# Patient Record
Sex: Female | Born: 1945
Health system: Southern US, Community
[De-identification: ages and names within clinical notes are randomized; demographics above are authoritative.]

## PROBLEM LIST (undated history)

## (undated) DIAGNOSIS — M1990 Unspecified osteoarthritis, unspecified site: Secondary | ICD-10-CM

## (undated) DIAGNOSIS — C801 Malignant (primary) neoplasm, unspecified: Secondary | ICD-10-CM

## (undated) DIAGNOSIS — T7840XA Allergy, unspecified, initial encounter: Secondary | ICD-10-CM

## (undated) DIAGNOSIS — E785 Hyperlipidemia, unspecified: Secondary | ICD-10-CM

## (undated) DIAGNOSIS — J309 Allergic rhinitis, unspecified: Secondary | ICD-10-CM

## (undated) DIAGNOSIS — E538 Deficiency of other specified B group vitamins: Secondary | ICD-10-CM

## (undated) DIAGNOSIS — M810 Age-related osteoporosis without current pathological fracture: Secondary | ICD-10-CM

## (undated) DIAGNOSIS — K219 Gastro-esophageal reflux disease without esophagitis: Secondary | ICD-10-CM

## (undated) HISTORY — DX: Allergic rhinitis, unspecified: J30.9

## (undated) HISTORY — DX: Malignant (primary) neoplasm, unspecified: C80.1

## (undated) HISTORY — DX: Age-related osteoporosis without current pathological fracture: M81.0

## (undated) HISTORY — DX: Deficiency of other specified B group vitamins: E53.8

## (undated) HISTORY — DX: Gastro-esophageal reflux disease without esophagitis: K21.9

## (undated) HISTORY — DX: Unspecified osteoarthritis, unspecified site: M19.90

## (undated) HISTORY — DX: Allergy, unspecified, initial encounter: T78.40XA

## (undated) HISTORY — DX: Hyperlipidemia, unspecified: E78.5

## (undated) HISTORY — PX: NASAL SINUS SURGERY: SHX719

---

## 1999-09-25 ENCOUNTER — Encounter: Admission: RE | Admit: 1999-09-25 | Discharge: 1999-09-25 | Payer: Self-pay | Admitting: Gynecology

## 1999-09-25 ENCOUNTER — Encounter: Payer: Self-pay | Admitting: Gynecology

## 2000-10-05 ENCOUNTER — Encounter: Payer: Self-pay | Admitting: Gynecology

## 2000-10-05 ENCOUNTER — Encounter: Admission: RE | Admit: 2000-10-05 | Discharge: 2000-10-05 | Payer: Self-pay | Admitting: Gynecology

## 2000-10-17 ENCOUNTER — Other Ambulatory Visit: Admission: RE | Admit: 2000-10-17 | Discharge: 2000-10-17 | Payer: Self-pay | Admitting: Gynecology

## 2000-12-05 ENCOUNTER — Encounter: Admission: RE | Admit: 2000-12-05 | Discharge: 2000-12-05 | Payer: Self-pay | Admitting: Gynecology

## 2000-12-05 ENCOUNTER — Encounter: Payer: Self-pay | Admitting: Gynecology

## 2001-09-26 ENCOUNTER — Emergency Department (HOSPITAL_COMMUNITY): Admission: EM | Admit: 2001-09-26 | Discharge: 2001-09-26 | Payer: Self-pay | Admitting: Emergency Medicine

## 2002-03-08 ENCOUNTER — Other Ambulatory Visit: Admission: RE | Admit: 2002-03-08 | Discharge: 2002-03-08 | Payer: Self-pay | Admitting: Gynecology

## 2003-02-13 ENCOUNTER — Encounter: Admission: RE | Admit: 2003-02-13 | Discharge: 2003-02-13 | Payer: Self-pay | Admitting: Gynecology

## 2003-03-11 ENCOUNTER — Other Ambulatory Visit: Admission: RE | Admit: 2003-03-11 | Discharge: 2003-03-11 | Payer: Self-pay | Admitting: Gynecology

## 2004-03-18 ENCOUNTER — Encounter: Admission: RE | Admit: 2004-03-18 | Discharge: 2004-03-18 | Payer: Self-pay | Admitting: Family Medicine

## 2004-05-06 ENCOUNTER — Other Ambulatory Visit: Admission: RE | Admit: 2004-05-06 | Discharge: 2004-05-06 | Payer: Self-pay | Admitting: Gynecology

## 2004-05-13 ENCOUNTER — Encounter: Admission: RE | Admit: 2004-05-13 | Discharge: 2004-05-13 | Payer: Self-pay | Admitting: Gynecology

## 2005-06-30 ENCOUNTER — Other Ambulatory Visit: Admission: RE | Admit: 2005-06-30 | Discharge: 2005-06-30 | Payer: Self-pay | Admitting: Gynecology

## 2005-07-08 ENCOUNTER — Encounter: Admission: RE | Admit: 2005-07-08 | Discharge: 2005-07-08 | Payer: Self-pay | Admitting: Gynecology

## 2006-09-27 ENCOUNTER — Other Ambulatory Visit: Admission: RE | Admit: 2006-09-27 | Discharge: 2006-09-27 | Payer: Self-pay | Admitting: Gynecology

## 2006-10-07 ENCOUNTER — Encounter: Admission: RE | Admit: 2006-10-07 | Discharge: 2006-10-07 | Payer: Self-pay | Admitting: Gynecology

## 2007-11-06 ENCOUNTER — Encounter: Admission: RE | Admit: 2007-11-06 | Discharge: 2007-11-06 | Payer: Self-pay | Admitting: Gynecology

## 2008-11-06 ENCOUNTER — Encounter: Payer: Self-pay | Admitting: Internal Medicine

## 2008-11-06 ENCOUNTER — Encounter: Admission: RE | Admit: 2008-11-06 | Discharge: 2008-11-06 | Payer: Self-pay | Admitting: Gynecology

## 2008-11-06 LAB — HM MAMMOGRAPHY

## 2008-11-25 ENCOUNTER — Encounter: Payer: Self-pay | Admitting: Internal Medicine

## 2008-12-03 ENCOUNTER — Encounter: Payer: Self-pay | Admitting: Internal Medicine

## 2008-12-16 ENCOUNTER — Encounter: Payer: Self-pay | Admitting: Internal Medicine

## 2009-01-28 ENCOUNTER — Encounter: Payer: Self-pay | Admitting: Internal Medicine

## 2009-04-29 ENCOUNTER — Encounter: Payer: Self-pay | Admitting: Internal Medicine

## 2009-10-24 ENCOUNTER — Ambulatory Visit: Payer: Self-pay | Admitting: Internal Medicine

## 2009-10-24 ENCOUNTER — Ambulatory Visit: Payer: Self-pay

## 2009-10-24 DIAGNOSIS — M810 Age-related osteoporosis without current pathological fracture: Secondary | ICD-10-CM

## 2009-10-24 DIAGNOSIS — M129 Arthropathy, unspecified: Secondary | ICD-10-CM | POA: Insufficient documentation

## 2009-10-24 DIAGNOSIS — E785 Hyperlipidemia, unspecified: Secondary | ICD-10-CM

## 2009-10-24 DIAGNOSIS — J309 Allergic rhinitis, unspecified: Secondary | ICD-10-CM | POA: Insufficient documentation

## 2009-10-27 ENCOUNTER — Telehealth: Payer: Self-pay | Admitting: Internal Medicine

## 2009-11-03 ENCOUNTER — Telehealth: Payer: Self-pay | Admitting: Internal Medicine

## 2009-11-07 ENCOUNTER — Encounter: Payer: Self-pay | Admitting: Internal Medicine

## 2009-11-10 ENCOUNTER — Encounter: Admission: RE | Admit: 2009-11-10 | Discharge: 2009-11-10 | Payer: Self-pay | Admitting: Gynecology

## 2010-01-14 ENCOUNTER — Ambulatory Visit: Payer: Self-pay | Admitting: Internal Medicine

## 2010-01-14 DIAGNOSIS — R141 Gas pain: Secondary | ICD-10-CM | POA: Insufficient documentation

## 2010-01-14 DIAGNOSIS — R143 Flatulence: Secondary | ICD-10-CM

## 2010-01-14 DIAGNOSIS — R142 Eructation: Secondary | ICD-10-CM

## 2010-03-01 LAB — CONVERTED CEMR LAB
ALT: 24 units/L
AST: 31 units/L
Albumin: 3.8 g/dL (ref 3.5–5.2)
Albumin: 4.2 g/dL
Basophils Relative: 0.2 %
Bilirubin Urine: NEGATIVE
Bilirubin, Direct: 0.1 mg/dL (ref 0.0–0.3)
CRP: 19.2 mg/dL
CRP: 2.8 mg/dL
Calcium: 9.5 mg/dL (ref 8.4–10.5)
Creatinine, Ser: 0.9 mg/dL (ref 0.4–1.2)
Eosinophils Absolute: 0.1 10*3/uL (ref 0.0–0.7)
Eosinophils Relative: 2 %
Glucose, Bld: 86 mg/dL
HCT: 40.6 % (ref 36.0–46.0)
HCT: 48.1 %
HDL: 33 mg/dL
Hemoglobin, Urine: NEGATIVE
LDL Cholesterol: 105 mg/dL
Leukocytes, UA: NEGATIVE
Lymphocytes Relative: 35.2 % (ref 12.0–46.0)
Lymphocytes, automated: 24 %
MCV: 96.4 fL (ref 78.0–100.0)
MCV: 98.6 fL
Monocytes Absolute: 0.8 10*3/uL (ref 0.1–1.0)
Monocytes Relative: 7 %
Monocytes Relative: 9.9 % (ref 3.0–12.0)
Neutrophils Relative %: 52.8 % (ref 43.0–77.0)
Neutrophils Relative %: 67 %
Nitrite: NEGATIVE
Potassium: 4.4 meq/L
RDW: 12.4 % (ref 11.5–14.6)
Sodium: 141 meq/L (ref 135–145)
Specific Gravity, Urine: >=1.03 (ref 1.000–1.030)
TSH: 1.32 microintl units/mL (ref 0.35–5.50)
Total CHOL/HDL Ratio: 5
Total Protein, Urine: NEGATIVE mg/dL
Triglyceride fasting, serum: 123 mg/dL
Triglycerides: 345 mg/dL — ABNORMAL HIGH (ref 0.0–149.0)
VLDL: 69 mg/dL — ABNORMAL HIGH (ref 0.0–40.0)
WBC: 8.5 10*3/uL (ref 4.5–10.5)
pH: 5.5 (ref 5.0–8.0)

## 2010-03-05 NOTE — Letter (Signed)
Summary: Gretta Cool MD  Gretta Cool MD   Imported By: Lester [place] 11/13/2009 09:49:38  _____________________________________________________________________  External Attachment:    Type:   Image     Comment:   External Document

## 2010-03-05 NOTE — Progress Notes (Signed)
Summary: Swelling  Phone Note Call from Patient Call back at Work Phone [phone]   Caller: Patient Summary of Call: Pt called stating that she is still having swelling of knees and ankles. Pt is requesting MD advise on next "step" or further testing? Please advise Initial call taken by: Margaret Pyle, CMA,  November 03, 2009 8:49 AM  Follow-up for Phone Call        refer to ortho for eval and tx of same Follow-up by: Newt Lukes MD,  November 03, 2009 9:13 AM  Additional Follow-up for Phone Call Additional follow up Details #1::        Pt informed and will expect call from [facility] with appt information. Additional Follow-up by: Margaret Pyle, CMA,  November 03, 2009 9:24 AM

## 2010-03-05 NOTE — Assessment & Plan Note (Signed)
Summary: NEW . [employer] / #/CD   Vital Signs:  Patient profile:   65 year old female Height:      61 inches (154.94 cm) Weight:      141.4 pounds (64.27 kg) BMI:     26.81 O2 Sat:      95 % on Room air Temp:     97.8 degrees F (36.56 degrees C) oral Pulse rate:   74 / minute BP sitting:   130 / 72  (left arm) Cuff size:   regular  Vitals Entered By: Orlan Leavens RMA (October 24, 2009 2:02 PM)  O2 Flow:  Room air CC: New patient Is Patient Diabetic? No Pain Assessment Patient in pain? no        Primary Care Marquon Alcala:  Newt Lukes MD  CC:  New patient.  History of Present Illness: new pt to me and our practice, here toe st care -  has followed with gyn recently, [facility] ([facility]) remotely  patient is here today for annual physical. Patient feels well . has always declined colo and never returned hemocult cards   also concerned with swelling of both ankles and right knee onset 3 weeks ago - symptoms worse at end of day - prolnged sitting at work denies injury or pain to knee/ankles but + "ache" behind right knee no change in meds or diet - no hx blood clots - quit smoking 11/2008  also review chronic med issues -  dyslipidemia - has not had reck since med rx'd 3 years ago - ran out, then refilled by gyn - reports compliance with ongoing medical treatment and no changes in medication dose or frequency. denies adverse side effects related to current therapy.   allg rhinitis - ?chornic sinus drainage - pressure but no pain, no fever but thick nasal dc down throat each am - no sneezing - HA with weather change - uses OTC meds as needed -some relief  osteoporosis - DEXA last year at gyn - progressive bone loss - prev on actonel, change to boniva by insurance pref - no bone pain or falls, no troubkle swallowing or jaw pain  OA - diffuse in hands, esp left thumb, wrists, elbows and neck   Preventive Screening-Counseling & Management  Alcohol-Tobacco     Alcohol  drinks/day: <1     Alcohol Counseling: not indicated; use of alcohol is not excessive or problematic     Smoking Status: quit     Year Quit: 11/2008     Tobacco Counseling: to remain off tobacco products  Caffeine-Diet-Exercise     Caffeine Counseling: not indicated; caffeine use is not excessive or problematic     Diet Counseling: to improve diet; diet is suboptimal     Does Patient Exercise: no     Exercise Counseling: to improve exercise regimen     Depression Counseling: not indicated; screening negative for depression  Safety-Violence-Falls     Seat Belt Counseling: not indicated; patient wears seat belts     Helmet Counseling: not applicable     Firearm Counseling: not indicated; uses recommended firearm safety measures     Violence Counseling: not indicated; no violence risk noted     Fall Risk Counseling: not indicated; no significant falls noted  Clinical Review Panels:  Prevention   Last Mammogram:  No specific mammographic evidence of malignancy.   (11/01/2008)   Last Pap Smear:  Interpretation Result:Negative for intraepithelial Lesion or Malignancy.    (11/01/2008)  Immunizations   Last Tetanus  Booster:  Historical (02/02/2003)   Current Medications (verified): 1)  Simvastatin 40 Mg Tabs (Simvastatin) .... Take 1 By Mouth Once Daily 2)  Thera-M Multivitamin .... Take 1 By Mouth Once Daily 3)  Fish Oil 2000 Mg Caps (Omega-3 Fatty Acids) .... Take Once Daily 4)  Aspirin 81 Mg Tabs (Aspirin) .... Once Daily 5)  Vitamin D3 2000 Unit Caps (Cholecalciferol) .... Take 1 By Mouth Once Daily 6)  Niacin 500 Mg Tabs (Niacin) .... Take 1 By Mouth Once Daily 7)  Omeprazole 20 Mg Tbec (Omeprazole) .... Take 1 By Mouth Once Daily 8)  Boniva 150 Mg Tabs (Ibandronate Sodium) .... Take 1 By Mouth Monthly  Allergies (verified): 1)  ! Penicillin  Past History:  Past Medical History: Hyperlipidemia osteoporosis GERD OA - left thumb CMC>hands, knee  MD roster: gyn -  lomax  Past Surgical History: Denies surgical history  Family History: Reviewed history and no changes required. Family History of Arthritis (both parents) Family History High cholesterol (both parents) Family History Hypertension (mother) Family History Lung cancer (father) Family History of Prostate CA 1st degree relative <50 (father)  Social History: Former Smoker - quit 11/2008 rare alcohol use married, lives with spouse and g-gson (raising him) works as Scientist, physiological at [employer] Smoking Status:  quit Does Patient Exercise:  no  Review of Systems       see HPI above. I have reviewed all other systems and they were negative.   Physical Exam  General:  alert, well-developed, well-nourished, and cooperative to examination.    Head:  Normocephalic and atraumatic without obvious abnormalities. No apparent alopecia or balding. Eyes:  vision grossly intact; pupils equal, round and reactive to light.  conjunctiva and lids normal.    Ears:  normal pinnae bilaterally, without erythema, swelling, or tenderness to palpation. TMs clear, without effusion, or cerumen impaction. Hearing grossly normal bilaterally  Mouth:  teeth and gums in good repair; mucous membranes moist, without lesions or ulcers. oropharynx clear without exudate, no erythema. +PND Neck:  supple, full ROM, no masses, no thyromegaly; no thyroid nodules or tenderness. no JVD or carotid bruits.   Lungs:  normal respiratory effort, no intercostal retractions or use of accessory muscles; normal breath sounds bilaterally - no crackles and no wheezes.    Heart:  normal rate, regular rhythm, no murmur, and no rub. BLE without edema but mild inc R>L LE at calf. normal DP pulses and normal cap refill in all 4 extremities    Abdomen:  soft, non-tender, normal bowel sounds, no distention; no masses and no appreciable hepatomegaly or splenomegaly.   Genitalia:  defer gyn Msk:  No deformity or scoliosis noted of thoracic or  lumbar spine.  right knee: full range of motion, no joint effusion or swelling but "fullness" posterior to knee. no erythema or abnormal warmth. Stable to ligamentous testing. Nontender to palpation. Neurovascularly intact.  Neurologic:  alert & oriented X3 and cranial nerves II-XII symetrically intact.  strength normal in all extremities, sensation intact to light touch, and gait normal. speech fluent without dysarthria or aphasia; follows commands with good comprehension.  Skin:  no rashes, vesicles, ulcers, or erythema. No nodules or irregularity to palpation.  Psych:  Oriented X3, memory intact for recent and remote, normally interactive, good eye contact, not anxious appearing, not depressed appearing, and not agitated.      Impression & Recommendations:  Problem # 1:  PREVENTIVE HEALTH CARE (ICD-V70.0) Patient has been counseled on age-appropriate routine health concerns for screening  and prevention. These are reviewed and up-to-date. Immunizations are up-to-date or declined. Labs ordered; EKG and colo refer declined.  Orders: TLB-Lipid Panel (80061-LIPID) TLB-BMP (Basic Metabolic Panel-BMET) (80048-METABOL) TLB-CBC Platelet - w/Differential (85025-CBCD) TLB-Hepatic/Liver Function Pnl (80076-HEPATIC) TLB-TSH (Thyroid Stimulating Hormone) (84443-TSH) TLB-Udip w/ Micro (81001-URINE)  Problem # 2:  ALLERGIC RHINITIS (ICD-477.9)  no evidence for sinus infx - add nasal steroids for pressure and discharge -  Her updated medication list for this problem includes:    Nasonex 50 Mcg/act Susp (Mometasone furoate) .Marland Kitchen... 2 sprays each nostril every morning  Discussed use of allergy medications and environmental measures.   Orders: Prescription Created Electronically [phone])  Problem # 3:  SWELLING, LIMB (ICD-729.81) min w/o edema R>L LE fullness -  ?ruptured bakers cyst seems most likely with known OA hx - check doppler - consider need for diuretic or NSAID afer further review - also labs  today r/o renal or liver dz Orders: Misc. Referral (Misc. Ref)  Problem # 4:  HYPERLIPIDEMIA (ICD-272.4) cehck las - send for prior records to review Her updated medication list for this problem includes:    Simvastatin 40 Mg Tabs (Simvastatin) .Marland Kitchen... Take 1 by mouth once daily    Niacin 500 Mg Tabs (Niacin) .Marland Kitchen... Take 1 by mouth once daily  Problem # 5:  OSTEOPOROSIS (ICD-733.00)  send for copt of DEXA from gyn to review cont vit d + calicum  with boniva Her updated medication list for this problem includes:    Boniva 150 Mg Tabs (Ibandronate sodium) .Marland Kitchen... Take 1 by mouth monthly  Discussed medication use, applications of heat or ice, and exercises.   Complete Medication List: 1)  Simvastatin 40 Mg Tabs (Simvastatin) .... Take 1 by mouth once daily 2)  Thera-m Multivitamin  .... Take 1 by mouth once daily 3)  Fish Oil 2000 Mg Caps (omega-3 Fatty Acids)  .... Take once daily 4)  Aspirin 81 Mg Tabs (Aspirin) .... Once daily 5)  Vitamin D3 2000 Unit Caps (Cholecalciferol) .... Take 1 by mouth once daily 6)  Niacin 500 Mg Tabs (Niacin) .... Take 1 by mouth once daily 7)  Omeprazole 20 Mg Tbec (Omeprazole) .... Take 1 by mouth once daily 8)  Boniva 150 Mg Tabs (Ibandronate sodium) .... Take 1 by mouth monthly 9)  Nasonex 50 Mcg/act Susp (Mometasone furoate) .... 2 sprays each nostril every morning  Patient Instructions: 1)  it was good to see you today. 2)  test(s) ordered today - your results will be posted on the phone tree for review in 48-72 hours from the time of test completion; call [phone] and enter your 9 digit MRN (listed above on this page, just below your name); if any changes need to be made or there are abnormal results, you will be contacted directly.  3)  will re-discuss colonoscopy and other recommended immunizations next visit 4)  use nasonex spray for your sinus and allergy symptoms - your prescription has been electronically submitted to your pharmacy. Please  take as directed. Contact our office if you believe you're having problems with the medication(s).  5)  no other medication changes today 6)  we'll make referral for doppler scan of your leg . Our office will contact you regarding this appointment once made.  Then, you will be called with results and treatment plans after review of these results 7)  will send for copy of labs/reports from dr. Nicholas Lose to review 8)  Please schedule a follow-up appointment in 3 months to continue review  of your medical health, call sooner if problems.  Prescriptions: NASONEX 50 MCG/ACT SUSP (MOMETASONE FUROATE) 2 sprays each nostril every morning  #1 x 1   Entered and Authorized by:   Newt Lukes MD   Signed by:   Newt Lukes MD on 10/24/2009   Method used:   Electronically to        [id number] (retail)       [address]       [place], [place]  [address]       Ph: [phone] or [phone]       Fax: [phone]   RxID:   [id number]    Immunization History:  Tetanus/Td Immunization History:    Tetanus/Td:  historical (02/02/2003)    Pap Smear  Procedure date:  11/01/2008  Findings:      Interpretation Result:Negative for intraepithelial Lesion or Malignancy.     Mammogram  Procedure date:  11/01/2008  Findings:      No specific mammographic evidence of malignancy.

## 2010-03-05 NOTE — Assessment & Plan Note (Signed)
Summary: 3 MTH FU---STC   Vital Signs:  Patient profile:   65 year old female Height:      61 inches (154.94 cm) Weight:      140.12 pounds (63.69 kg) O2 Sat:      94 % on Room air Temp:     98.6 degrees F (37.00 degrees C) oral Pulse rate:   84 / minute BP sitting:   120 / 78  (left arm) Cuff size:   regular  Vitals Entered By: Orlan Leavens RMA (January 14, 2010 2:33 PM)  O2 Flow:  Room air CC: 3 month follow-up Is Patient Diabetic? No Pain Assessment Patient in pain? no      Comments Want to discuss boniva   Primary Care Provider:  Newt Lukes MD  CC:  3 month follow-up.  History of Present Illness: here for f/u  dyslipidemia - reports compliance with ongoing medical treatment and no changes in medication dose or frequency. denies adverse side effects related to current therapy.   allg rhinitis - chronic sinus drainage - pressure but no pain, no fever but thick nasal dc down throat each am - no sneezing - HA with weather change - uses OTC meds as needed -some relief  osteoporosis - DEXA last year at gyn - progressive bone loss - prev on actonel, change to boniva by insurance pref - no bone pain or falls, no troubkle swallowing or jaw pain - feels boniva causes more reflux symptoms and heartburn than actonel - would like to resume monthly actonel  OA - diffuse in hands, esp left thumb, wrists, elbows and neck   Clinical Review Panels:  Prevention   Last Mammogram:  [facility].   No specific mammographic evidence of malignancy.  Assessment: BIRADS 1. (11/06/2008)   Last Pap Smear:  Interpretation Result:Negative for intraepithelial Lesion or Malignancy.    (11/25/2008)  Lipid Management   Cholesterol:  185 (10/24/2009)   LDL (bad choesterol):  105 (04/29/2009)   HDL (good cholesterol):  40.30 (10/24/2009)   Triglycerides:  173 (04/29/2009)  CBC   WBC:  8.5 (10/24/2009)   RBC:  4.21 (10/24/2009)   Hgb:  13.9  (10/24/2009)   Hct:  40.6 (10/24/2009)   Platelets:  242.0 (10/24/2009)   MCV  96.4 (10/24/2009)   MCHC  34.4 (10/24/2009)   RDW  12.4 (10/24/2009)   PMN:  52.8 (10/24/2009)   Lymphs:  35.2 (10/24/2009)   Monos:  9.9 (10/24/2009)   Eosinophils:  1.5 (10/24/2009)   Basophil:  0.6 (10/24/2009)  Complete Metabolic Panel   Glucose:  102 (10/24/2009)   Sodium:  141 (10/24/2009)   Potassium:  3.8 (10/24/2009)   Chloride:  105 (10/24/2009)   CO2:  27 (10/24/2009)   BUN:  19 (10/24/2009)   Creatinine:  0.9 (10/24/2009)   Albumin:  3.8 (10/24/2009)   Total Protein:  6.6 (10/24/2009)   Calcium:  9.5 (10/24/2009)   Total Bili:  0.4 (10/24/2009)   Alk Phos:  91 (10/24/2009)   SGPT (ALT):  22 (10/24/2009)   SGOT (AST):  27 (10/24/2009)   Current Medications (verified): 1)  Simvastatin 40 Mg Tabs (Simvastatin) .... Take 1 By Mouth Once Daily 2)  Thera-M Multivitamin .... Take 1 By Mouth Once Daily 3)  Fish Oil 2000 Mg Caps (Omega-3 Fatty Acids) .... Take Once Daily 4)  Aspirin 81 Mg Tabs (Aspirin) .... Once Daily 5)  Vitamin D3 2000 Unit Caps (Cholecalciferol) .... Take 1 By Mouth Once Daily 6)  Niacin 500 Mg Tabs (Niacin) .... Take 1 By Mouth Once Daily As Needed 7)  Omeprazole 20 Mg Tbec (Omeprazole) .... Take 1 By Mouth Once Daily 8)  Boniva 150 Mg Tabs (Ibandronate Sodium) .... Take 1 By Mouth Monthly 9)  Nasonex 50 Mcg/act Susp (Mometasone Furoate) .... 2 Sprays Each Nostril Every Morning 10)  Meloxicam 7.5 Mg Tabs (Meloxicam) .... Take 1 By Mouth As Needed For Arthritis Pain/swelling 11)  B Complex 100  Tabs (B Complex Vitamins) .... Take 1 By Mouth Once Daily  Allergies (verified): 1)  ! Penicillin  Past History:  Past Medical History: Hyperlipidemia osteoporosis  GERD  OA - left thumb CMC>hands, knee  MD roster: gyn - lomax  Review of Systems  The patient denies fever, weight gain, syncope, and headaches.    Physical Exam  General:  alert, well-developed,  well-nourished, and cooperative to examination.    Lungs:  normal respiratory effort, no intercostal retractions or use of accessory muscles; normal breath sounds bilaterally - no crackles and no wheezes.    Heart:  normal rate, regular rhythm, no murmur, and no rub. BLE without edema. normal DP pulses and normal cap refill in all 4 extremities      Impression & Recommendations:  Problem # 1:  OSTEOPOROSIS (ICD-733.00)  pt prefers actonel to boniva - requests rx for both as insurance has prev covered only boniva pt understands she is take one OR the other, NOT both! cont Ca+Vit D as well Her updated medication list for this problem includes:    Boniva 150 Mg Tabs (Ibandronate sodium) .Marland Kitchen... Take 1 by mouth monthly    Actonel 150 Mg Tabs (Risedronate sodium) ..... Once by mouth each month  Discussed medication use, applications of heat or ice, and exercises.   Problem # 2:  HYPERLIPIDEMIA (ICD-272.4)  Her updated medication list for this problem includes:    Simvastatin 40 Mg Tabs (Simvastatin) .Marland Kitchen... Take 1 by mouth once daily    Niacin 500 Mg Tabs (Niacin) .Marland Kitchen... Take 1 by mouth once daily as needed  Labs Reviewed: SGOT: 27 (10/24/2009)   SGPT: 22 (10/24/2009)   HDL:40.30 (10/24/2009), 48 (04/29/2009)  LDL:105 (04/29/2009), 75 (11/91/4782)  Chol:185 (10/24/2009), 195 (04/29/2009)  Trig:345.0 (10/24/2009), 173 (04/29/2009)  Problem # 3:  ABDOMINAL BLOATING (ICD-787.3) ?IBS symptoms - rec trial probiotic - sample align given also gas x as needed rec colo screening as past due for age  Time spent with patient 25 minutes, more than 50% of this time was spent counseling patient on GI symptoms, medication options for osteoporosis and problems concerning mother's age realted illnesses  Complete Medication List: 1)  Simvastatin 40 Mg Tabs (Simvastatin) .... Take 1 by mouth once daily 2)  Thera-m Multivitamin  .... Take 1 by mouth once daily 3)  Fish Oil 2000 Mg Caps (omega-3 Fatty Acids)   .... Take once daily 4)  Aspirin 81 Mg Tabs (Aspirin) .... Once daily 5)  Vitamin D3 2000 Unit Caps (Cholecalciferol) .... Take 1 by mouth once daily 6)  Niacin 500 Mg Tabs (Niacin) .... Take 1 by mouth once daily as needed 7)  Omeprazole 20 Mg Tbec (Omeprazole) .... Take 1 by mouth once daily 8)  Nasonex 50 Mcg/act Susp (Mometasone furoate) .... 2 sprays each nostril every morning 9)  Meloxicam 7.5 Mg Tabs (Meloxicam) .... Take 1 by mouth as needed for arthritis pain/swelling 10)  B Complex 100 Tabs (B complex vitamins) .... Take 1 by mouth once daily 11)  Boniva 150  Mg Tabs (Ibandronate sodium) .... Take 1 by mouth monthly 12)  Actonel 150 Mg Tabs (Risedronate sodium) .... Once by mouth each month  Patient Instructions: 1)  it was good to see you today. 2)  will re-discuss colonoscopy and other recommended immunizations next visit 3)  use Align (or other probiotics) x 30days and use Gas X as needed for bloating symptoms  4)  change boniva to actonel if ok with insurance - but do not take both! 5)  no other medication changes today 6)  Please schedule a follow-up appointment in 6 months to check cholesterol, call sooner if problems.  Prescriptions: SIMVASTATIN 40 MG TABS (SIMVASTATIN) take 1 by mouth once daily  #90 x 3   Entered and Authorized by:   Newt Lukes MD   Signed by:   Newt Lukes MD on 01/14/2010   Method used:   Print then Give to Patient   RxID:   [id number] BONIVA 150 MG TABS (IBANDRONATE SODIUM) take 1 by mouth monthly  #3 x 3   Entered and Authorized by:   Newt Lukes MD   Signed by:   Newt Lukes MD on 01/14/2010   Method used:   Print then Give to Patient   RxID:   [id number] ACTONEL 150 MG TABS (RISEDRONATE SODIUM) once by mouth each month  #3 x 3   Entered and Authorized by:   Newt Lukes MD   Signed by:   Newt Lukes MD on 01/14/2010   Method used:   Print then Give to Patient   RxID:    [id number]    Orders Added: 1)  Est. Patient Level IV [01027]

## 2010-03-05 NOTE — Progress Notes (Signed)
Summary: doppler: neg DVT  ---- Converted from flag ---- ---- 10/24/2009 5:22 PM, Joann Peterson wrote: Joann Peterson, DOB Aug 19, 2045, is neg for DVT. ------------------------------  ok - pt will be notified of same - no change tc rex

## 2010-03-05 NOTE — Miscellaneous (Signed)
Summary: Orders Update  Clinical Lists Changes  Orders: Added new Test order of Venous Duplex Lower Extremity (Venous Duplex Lower) - Signed 

## 2010-07-10 ENCOUNTER — Encounter: Payer: Self-pay | Admitting: Internal Medicine

## 2010-07-21 ENCOUNTER — Other Ambulatory Visit: Payer: Self-pay | Admitting: Internal Medicine

## 2010-07-21 ENCOUNTER — Other Ambulatory Visit (INDEPENDENT_AMBULATORY_CARE_PROVIDER_SITE_OTHER): Payer: BC Managed Care – PPO

## 2010-07-21 ENCOUNTER — Encounter: Payer: Self-pay | Admitting: Internal Medicine

## 2010-07-21 ENCOUNTER — Ambulatory Visit (INDEPENDENT_AMBULATORY_CARE_PROVIDER_SITE_OTHER): Payer: BC Managed Care – PPO | Admitting: Internal Medicine

## 2010-07-21 DIAGNOSIS — E785 Hyperlipidemia, unspecified: Secondary | ICD-10-CM

## 2010-07-21 DIAGNOSIS — R202 Paresthesia of skin: Secondary | ICD-10-CM

## 2010-07-21 DIAGNOSIS — R209 Unspecified disturbances of skin sensation: Secondary | ICD-10-CM

## 2010-07-21 DIAGNOSIS — Z79899 Other long term (current) drug therapy: Secondary | ICD-10-CM

## 2010-07-21 DIAGNOSIS — Z1211 Encounter for screening for malignant neoplasm of colon: Secondary | ICD-10-CM

## 2010-07-21 DIAGNOSIS — R200 Anesthesia of skin: Secondary | ICD-10-CM

## 2010-07-21 DIAGNOSIS — E538 Deficiency of other specified B group vitamins: Secondary | ICD-10-CM | POA: Insufficient documentation

## 2010-07-21 LAB — HEPATIC FUNCTION PANEL
ALT: 25 U/L (ref 0–35)
Bilirubin, Direct: 0.2 mg/dL (ref 0.0–0.3)

## 2010-07-21 LAB — LIPID PANEL
Cholesterol: 197 mg/dL (ref 0–200)
Total CHOL/HDL Ratio: 4
Triglycerides: 237 mg/dL — ABNORMAL HIGH (ref 0.0–149.0)

## 2010-07-21 NOTE — Patient Instructions (Signed)
It was good to see you today. Medications reviewed, no changes at this time. Test(s) ordered today. Your results will be called to you after review (48-72hours after test completion). If any changes need to be made, you will be notified at that time. we'll make referral to GI for colonoscopy. Our office will contact you regarding appointment(s) once made. Please schedule followup in 6 months, call sooner if problems.

## 2010-07-21 NOTE — Assessment & Plan Note (Signed)
On statin - tolerating well Check lipids and LFTs now

## 2010-07-21 NOTE — Progress Notes (Signed)
  Subjective:    Patient ID: Joann Peterson, female    DOB: [birth year], 65 y.o.   MRN: [id number]  HPI Here for follow up - reviewed chronic medical issues today:  dyslipidemia - reports compliance with ongoing medical treatment and no changes in medication dose or frequency. denies adverse side effects related to current therapy.     allergic rhinitis - chronic sinus drainage - pressure but no pain, no fever but thick nasal dc down throat each am - no sneezing - HA with weather change - uses OTC meds as needed -some relief   osteoporosis - DEXA 2010 at gyn - progressive bone loss -on actonel, prev changed to boniva by insurance pref in 2011 - no bone pain or falls, no trouble swallowing or jaw pain - felt boniva caused more reflux symptoms and heartburn than actonel    OA - diffuse in hands, esp left thumb, wrists, elbows and neck   Past Medical History  Diagnosis Date  . ALLERGIC RHINITIS   . OA (osteoarthritis)     Left thumb, hand and knees  . GERD (gastroesophageal reflux disease)   . HYPERLIPIDEMIA   . OSTEOPOROSIS     Review of Systems  Neurological: Positive for numbness (B hands, extensor surface over 3/4 MCP to wrist).       Objective:   Physical Exam BP 122/78  Pulse 70  Temp(Src) 97.4 F (36.3 C) (Oral)  Ht 5\' 1"  (1.549 m)  Wt 137 lb 12.8 oz (62.506 kg)  BMI 26.04 kg/m2  SpO2 95% Wt Readings from Last 3 Encounters:  07/21/10 137 lb 12.8 oz (62.506 kg)  01/14/10 140 lb 1.9 oz (63.557 kg)  10/24/09 141 lb 6.4 oz (64.139 kg)    Physical Exam  Constitutional: She is oriented to person, place, and time. She appears well-developed and well-nourished. No distress.  Neck: Normal range of motion. Neck supple. No JVD present. No thyromegaly present.  Cardiovascular: Normal rate, regular rhythm and normal heart sounds.  No murmur heard. No BLE edema. Pulmonary/Chest: Effort normal and breath sounds normal. No respiratory distress. She has no wheezes.   Skin: Skin is  warm and dry. No rash noted. No erythema.  Psychiatric: She has a normal mood and affect. Her behavior is normal. Judgment and thought content normal.       Lab Results  Component Value Date   WBC 8.5 10/24/2009   HGB 13.9 10/24/2009   HCT 40.6 10/24/2009   PLT 242.0 10/24/2009   CHOL 185 10/24/2009   TRIG 345.0* 10/24/2009   HDL 40.30 10/24/2009   LDLDIRECT 97.0 10/24/2009   ALT 22 10/24/2009   AST 27 10/24/2009   NA 141 10/24/2009   K 3.8 10/24/2009   CL 105 10/24/2009   CREATININE 0.9 10/24/2009   BUN 19 10/24/2009   CO2 27 10/24/2009   TSH 1.32 10/24/2009    Assessment & Plan:  See problem list. Medications and labs reviewed today.  Numbness in hands - TSH ok 10/2009 - check b12 levels now

## 2010-07-23 ENCOUNTER — Ambulatory Visit (INDEPENDENT_AMBULATORY_CARE_PROVIDER_SITE_OTHER): Payer: BC Managed Care – PPO | Admitting: *Deleted

## 2010-07-23 DIAGNOSIS — E538 Deficiency of other specified B group vitamins: Secondary | ICD-10-CM

## 2010-07-24 ENCOUNTER — Ambulatory Visit: Payer: BC Managed Care – PPO

## 2010-07-24 MED ORDER — CYANOCOBALAMIN 1000 MCG/ML IJ SOLN
1000.0000 ug | Freq: Once | INTRAMUSCULAR | Status: AC
  Administered 2010-07-23: 1000 ug via INTRAMUSCULAR

## 2010-08-24 ENCOUNTER — Ambulatory Visit (INDEPENDENT_AMBULATORY_CARE_PROVIDER_SITE_OTHER): Payer: BC Managed Care – PPO

## 2010-08-24 DIAGNOSIS — E538 Deficiency of other specified B group vitamins: Secondary | ICD-10-CM

## 2010-08-24 MED ORDER — CYANOCOBALAMIN 1000 MCG/ML IJ SOLN
1000.0000 ug | Freq: Once | INTRAMUSCULAR | Status: AC
  Administered 2010-08-24: 1000 ug via INTRAMUSCULAR

## 2010-09-15 ENCOUNTER — Encounter: Payer: Self-pay | Admitting: Internal Medicine

## 2010-09-28 ENCOUNTER — Ambulatory Visit (INDEPENDENT_AMBULATORY_CARE_PROVIDER_SITE_OTHER): Payer: BC Managed Care – PPO | Admitting: *Deleted

## 2010-09-28 DIAGNOSIS — E538 Deficiency of other specified B group vitamins: Secondary | ICD-10-CM

## 2010-09-28 MED ORDER — CYANOCOBALAMIN 1000 MCG/ML IJ SOLN
1000.0000 ug | Freq: Once | INTRAMUSCULAR | Status: AC
  Administered 2010-09-28: 1000 ug via INTRAMUSCULAR

## 2010-10-07 ENCOUNTER — Other Ambulatory Visit: Payer: Self-pay | Admitting: Gynecology

## 2010-10-07 DIAGNOSIS — Z1231 Encounter for screening mammogram for malignant neoplasm of breast: Secondary | ICD-10-CM

## 2010-10-29 ENCOUNTER — Ambulatory Visit: Payer: BC Managed Care – PPO

## 2010-11-03 ENCOUNTER — Ambulatory Visit (INDEPENDENT_AMBULATORY_CARE_PROVIDER_SITE_OTHER): Payer: BC Managed Care – PPO | Admitting: *Deleted

## 2010-11-03 DIAGNOSIS — E538 Deficiency of other specified B group vitamins: Secondary | ICD-10-CM

## 2010-11-03 MED ORDER — CYANOCOBALAMIN 1000 MCG/ML IJ SOLN
1000.0000 ug | Freq: Once | INTRAMUSCULAR | Status: AC
  Administered 2010-11-03: 1000 ug via INTRAMUSCULAR

## 2010-11-06 ENCOUNTER — Ambulatory Visit: Payer: BC Managed Care – PPO

## 2010-11-12 ENCOUNTER — Ambulatory Visit
Admission: RE | Admit: 2010-11-12 | Discharge: 2010-11-12 | Disposition: A | Discharge status: Home / Self Care | Payer: BC Managed Care – PPO | Source: Ambulatory Visit | Attending: Gynecology | Admitting: Gynecology

## 2010-11-12 DIAGNOSIS — Z1231 Encounter for screening mammogram for malignant neoplasm of breast: Secondary | ICD-10-CM

## 2010-12-07 ENCOUNTER — Ambulatory Visit: Payer: Medicare Other

## 2010-12-07 ENCOUNTER — Ambulatory Visit (INDEPENDENT_AMBULATORY_CARE_PROVIDER_SITE_OTHER): Payer: Medicare Other

## 2010-12-07 DIAGNOSIS — E538 Deficiency of other specified B group vitamins: Secondary | ICD-10-CM

## 2010-12-07 MED ORDER — CYANOCOBALAMIN 1000 MCG/ML IJ SOLN
1000.0000 ug | Freq: Once | INTRAMUSCULAR | Status: AC
  Administered 2010-12-07: 1000 ug via INTRAMUSCULAR

## 2010-12-25 ENCOUNTER — Encounter: Payer: Self-pay | Admitting: Internal Medicine

## 2010-12-25 ENCOUNTER — Ambulatory Visit (INDEPENDENT_AMBULATORY_CARE_PROVIDER_SITE_OTHER): Payer: BC Managed Care – PPO | Admitting: Internal Medicine

## 2010-12-25 VITALS — BP 118/68 | HR 114 | Temp 100.9°F | Wt 137.0 lb

## 2010-12-25 DIAGNOSIS — J209 Acute bronchitis, unspecified: Secondary | ICD-10-CM

## 2010-12-25 DIAGNOSIS — J309 Allergic rhinitis, unspecified: Secondary | ICD-10-CM

## 2010-12-25 DIAGNOSIS — J019 Acute sinusitis, unspecified: Secondary | ICD-10-CM

## 2010-12-25 MED ORDER — LEVOFLOXACIN 250 MG PO TABS: 250.0000 mg | ORAL_TABLET | Freq: Every day | ORAL | Status: DC

## 2010-12-25 MED ORDER — HYDROCODONE-HOMATROPINE 5-1.5 MG/5ML PO SYRP: 5.0000 mL | ORAL_SOLUTION | Freq: Four times a day (QID) | ORAL | Status: AC | PRN

## 2010-12-25 NOTE — Patient Instructions (Signed)
Take all new medications as prescribed Continue all other medications as before  

## 2010-12-25 NOTE — Assessment & Plan Note (Signed)
Mild to mod, for antibx course,  to f/u any worsening symptoms or concerns 

## 2010-12-26 ENCOUNTER — Encounter: Payer: Self-pay | Admitting: Internal Medicine

## 2010-12-26 NOTE — Assessment & Plan Note (Signed)
OK to try OTC allegra prn,   to f/u any worsening symptoms or concerns

## 2010-12-26 NOTE — Assessment & Plan Note (Signed)
Mild to mod, for antibx course,  to f/u any worsening symptoms or concerns 

## 2010-12-26 NOTE — Progress Notes (Signed)
Subjective:    Patient ID: Joann Peterson, female    DOB: August 18, 1945, 65 y.o.   MRN: [id number]  HPI   Here with 3 days acute onset fever up to 102.5 yesterday, facial pain, pressure, general weakness and malaise, and Pt denies chest pain, increased sob or doe, wheezing, orthopnea, PND, increased LE swelling, palpitations, dizziness or syncope.  Also here with acute onset mild to mod 2-3 days ST, HA, general weakness and malaise, with prod cough greenish sputum.  Also Does have several wks ongoing nasal allergy symptoms with clear congestion, itch and sneeze, without fever, pain, ST, cough or wheezing. Past Medical History  Diagnosis Date  . ALLERGIC RHINITIS   . OA (osteoarthritis)     Left thumb, hand and knees  . GERD (gastroesophageal reflux disease)   . HYPERLIPIDEMIA   . OSTEOPOROSIS    Past Surgical History  Procedure Date  . No past surgeries     reports that she quit smoking about 2 years ago. She does not have any smokeless tobacco history on file. She reports that she drinks alcohol. She reports that she does not use illicit drugs. family history includes Arthritis in her father and mother; Hyperlipidemia in her father and mother; Hypertension in her mother; Lung cancer in her father; and Prostate cancer in her father. Allergies  Allergen Reactions  . Penicillins    Current Outpatient Prescriptions on File Prior to Visit  Medication Sig Dispense Refill  . Cholecalciferol (VITAMIN D) 2000 UNITS tablet Take 2,000 Units by mouth daily.        . mometasone (NASONEX) 50 MCG/ACT nasal spray Place 2 sprays into the nose every morning.        Marland Kitchen omeprazole (PRILOSEC) 20 MG capsule Take 20 mg by mouth daily.        . risedronate (ACTONEL) 150 MG tablet Take 150 mg by mouth every 30 (thirty) days. with water on empty stomach, nothing by mouth or lie down for next 30 minutes.       . simvastatin (ZOCOR) 40 MG tablet Take 40 mg by mouth at bedtime.        Marland Kitchen therapeutic  multivitamin-minerals (THERAGRAN-M) tablet Take 1 tablet by mouth daily.        Marland Kitchen aspirin 81 MG tablet Take 81 mg by mouth daily.        . Omega-3 Fatty Acids (FISH OIL) 1000 MG CAPS Take by mouth daily.         Review of Systems Review of Systems  Constitutional: Negative for diaphoresis and unexpected weight change.  HENT: Negative for drooling and tinnitus.   Eyes: Negative for photophobia and visual disturbance.  Respiratory: Negative for choking and stridor.   Gastrointestinal: Negative for vomiting and blood in stool.     Objective:   Physical Exam BP 118/68  Pulse 114  Temp(Src) 100.9 F (38.3 C) (Oral)  Wt 137 lb (62.143 kg)  SpO2 91% Physical Exam  VS noted, mild ill Constitutional: Pt appears well-developed and well-nourished.  HENT: Head: Normocephalic.  Right Ear: External ear normal.  Left Ear: External ear normal.  Bilat tm's mild erythema.  Sinus tender bilat.  Pharynx mild erythema Eyes: Conjunctivae and EOM are normal. Pupils are equal, round, and reactive to light.  Neck: Normal range of motion. Neck supple.  Cardiovascular: Normal rate and regular rhythm.   Pulmonary/Chest: Effort normal and breath sounds normal.  Skin: Skin is warm. No erythema.  Psychiatric: Pt behavior is normal. Thought content normal.  Assessment & Plan:

## 2010-12-31 ENCOUNTER — Ambulatory Visit (INDEPENDENT_AMBULATORY_CARE_PROVIDER_SITE_OTHER)
Admission: RE | Admit: 2010-12-31 | Discharge: 2010-12-31 | Disposition: A | Discharge status: Home / Self Care | Payer: BC Managed Care – PPO | Source: Ambulatory Visit | Attending: Endocrinology | Admitting: Endocrinology

## 2010-12-31 ENCOUNTER — Ambulatory Visit (INDEPENDENT_AMBULATORY_CARE_PROVIDER_SITE_OTHER): Payer: Medicare Other | Admitting: Endocrinology

## 2010-12-31 ENCOUNTER — Encounter: Payer: Self-pay | Admitting: Endocrinology

## 2010-12-31 VITALS — BP 122/72 | HR 88 | Temp 100.0°F | Ht 64.0 in | Wt 138.0 lb

## 2010-12-31 DIAGNOSIS — R059 Cough, unspecified: Secondary | ICD-10-CM

## 2010-12-31 DIAGNOSIS — R05 Cough: Secondary | ICD-10-CM

## 2010-12-31 DIAGNOSIS — J209 Acute bronchitis, unspecified: Secondary | ICD-10-CM

## 2010-12-31 MED ORDER — AZITHROMYCIN 500 MG PO TABS: 500.0000 mg | ORAL_TABLET | Freq: Every day | ORAL | Status: AC

## 2010-12-31 NOTE — Progress Notes (Signed)
Subjective:    Patient ID: Joann Peterson, female    DOB: [birth year], 65 y.o.   MRN: [id number]  HPI Pt states 10 days of slight pain at the right ear, and assoc prod-quality cough.  Fever is improved but not resolved. Past Medical History  Diagnosis Date  . ALLERGIC RHINITIS   . OA (osteoarthritis)     Left thumb, hand and knees  . GERD (gastroesophageal reflux disease)   . HYPERLIPIDEMIA   . OSTEOPOROSIS     Past Surgical History  Procedure Date  . No past surgeries     History   Social History  . Marital Status: Married    Spouse Name: N/A    Number of Children: N/A  . Years of Education: N/A   Occupational History  . Not on file.   Social History Main Topics  . Smoking status: Former Smoker    Quit date: 11/01/2008  . Smokeless tobacco: Not on file   Comment: Married, lives with spouse and g-son (raising him). Works as Electronics engineer at [employer]  . Alcohol Use: Yes     Rarely  . Drug Use: No  . Sexually Active:    Other Topics Concern  . Not on file   Social History Narrative  . No narrative on file    Current Outpatient Prescriptions on File Prior to Visit  Medication Sig Dispense Refill  . aspirin 81 MG tablet Take 81 mg by mouth daily.        . Cholecalciferol (VITAMIN D) 2000 UNITS tablet Take 2,000 Units by mouth daily.        Marland Kitchen HYDROcodone-homatropine (HYCODAN) 5-1.5 MG/5ML syrup Take 5 mLs by mouth every 6 (six) hours as needed for cough.  120 mL  1  . mometasone (NASONEX) 50 MCG/ACT nasal spray Place 2 sprays into the nose every morning.        . Omega-3 Fatty Acids (FISH OIL) 1000 MG CAPS Take by mouth daily.        Marland Kitchen omeprazole (PRILOSEC) 20 MG capsule Take 20 mg by mouth daily.        . risedronate (ACTONEL) 150 MG tablet Take 150 mg by mouth every 30 (thirty) days. with water on empty stomach, nothing by mouth or lie down for next 30 minutes.       . simvastatin (ZOCOR) 40 MG tablet Take 40 mg by mouth at bedtime.        Marland Kitchen therapeutic  multivitamin-minerals (THERAGRAN-M) tablet Take 1 tablet by mouth daily.          Allergies  Allergen Reactions  . Penicillins     Family History  Problem Relation Age of Onset  . Arthritis Mother   . Hyperlipidemia Mother   . Hypertension Mother   . Arthritis Father   . Hyperlipidemia Father   . Lung cancer Father   . Prostate cancer Father    BP 122/72  Pulse 88  Temp(Src) 100 F (37.8 C) (Oral)  Ht 5\' 4"  (1.626 m)  Wt 138 lb (62.596 kg)  BMI 23.69 kg/m2  SpO2 94%  Review of Systems She reports sore throat and nausea.    Objective:   Physical Exam VITAL SIGNS:  See vs page GENERAL: no distress head: no deformity eyes: no periorbital swelling, no proptosis external nose and ears are normal mouth: no lesion seen Right tm is red, left is normal NECK: There is no palpable thyroid enlargement.  No thyroid nodule is palpable.  No palpable lymphadenopathy  at the anterior neck. LUNGS:  Clear to auscultation   Cxr: nad    Assessment & Plan:  URI, new

## 2010-12-31 NOTE — Patient Instructions (Addendum)
i have sent a prescription to your pharmacy, for a different antibiotic. I hope you feel better soon.  If you don't feel better by next week, please call dr Jonny Ruiz. Let's check a chest-x-ray.  please call [phone] to hear your test results.  You will be prompted to enter the 9-digit "MRN" number that appears at the top left of this page, followed by #.  Then you will hear the message. (update: i left message on phone-tree:  rx as we discussed)

## 2011-01-07 ENCOUNTER — Ambulatory Visit: Payer: Medicare Other | Admitting: *Deleted

## 2011-01-12 ENCOUNTER — Ambulatory Visit (INDEPENDENT_AMBULATORY_CARE_PROVIDER_SITE_OTHER): Payer: Medicare Other | Admitting: Internal Medicine

## 2011-01-12 ENCOUNTER — Encounter: Payer: Self-pay | Admitting: Internal Medicine

## 2011-01-12 VITALS — BP 112/74 | HR 67 | Temp 97.0°F | Wt 135.4 lb

## 2011-01-12 DIAGNOSIS — E538 Deficiency of other specified B group vitamins: Secondary | ICD-10-CM

## 2011-01-12 DIAGNOSIS — Z23 Encounter for immunization: Secondary | ICD-10-CM

## 2011-01-12 DIAGNOSIS — Z1211 Encounter for screening for malignant neoplasm of colon: Secondary | ICD-10-CM

## 2011-01-12 DIAGNOSIS — E785 Hyperlipidemia, unspecified: Secondary | ICD-10-CM

## 2011-01-12 DIAGNOSIS — M810 Age-related osteoporosis without current pathological fracture: Secondary | ICD-10-CM

## 2011-01-12 MED ORDER — CYANOCOBALAMIN 1000 MCG PO TABS: 1000.0000 ug | ORAL_TABLET | Freq: Every day | ORAL | Status: DC

## 2011-01-12 NOTE — Assessment & Plan Note (Signed)
Dx 07/2010 - on IM replacement for same Plan recheck next OV - ok to take oral as well

## 2011-01-12 NOTE — Patient Instructions (Signed)
It was good to see you today. Medications reviewed, no changes at this time. Ok to take B12 pills as discussed in addition to "B12 shots" each month Flu shot done today - We will plan Shingles vaccine at your next B12 shot we'll make referral to GI for colonoscopy. Our office will contact you regarding appointment(s) once made. Please schedule followup in 6 months for cholesterol, B12 and thyroid check, call sooner if problems.

## 2011-01-12 NOTE — Progress Notes (Signed)
  Subjective:    Patient ID: Joann Peterson, female    DOB: 01-02-1946, 65 y.o.   MRN: [id number]  HPI  Here for follow up - reviewed chronic medical issues today:  dyslipidemia - reports compliance with ongoing medical treatment and no changes in medication dose or frequency. denies adverse side effects related to current therapy.     allergic rhinitis - chronic sinus drainage - pressure but no pain, no fever but thick nasal dc down throat each am - no sneezing - headache with weather change - uses OTC meds as needed -some relief   osteoporosis - dx DEXA 2010 > repeat in 2012 was improved (followed by gyn - L[facility]ax) - progressive bone loss -on actonel, prev changed to boniva by insurance pref in 2011 - no bone pain or falls, no trouble swallowing or jaw pain - felt boniva caused more reflux symptoms and heartburn than actonel    OA - diffuse in hands, esp left thumb, wrists, elbows and neck  B12 deficiency - dx 07/2010 - on B12 IM replacement for same - no improvement in numbness or fatigue   Past Medical History  Diagnosis Date  . ALLERGIC RHINITIS   . OA (osteoarthritis)     Left thumb, hand and knees  . GERD (gastroesophageal reflux disease)   . HYPERLIPIDEMIA   . OSTEOPOROSIS   . B12 nutritional deficiency dx 10/2010    Review of Systems  Constitutional: Negative for fever and unexpected weight change.  Respiratory: Negative for shortness of breath and wheezing.   Neurological: Positive for numbness (persisting B hands). Negative for weakness and headaches.       Objective:   Physical Exam  BP 112/74  Pulse 67  Temp(Src) 97 F (36.1 C) (Oral)  Wt 135 lb 6.4 oz (61.417 kg)  SpO2 95% Wt Readings from Last 3 Encounters:  01/12/11 135 lb 6.4 oz (61.417 kg)  12/31/10 138 lb (62.596 kg)  12/25/10 137 lb (62.143 kg)    Constitutional: She appears well-developed and well-nourished. No distress.  Neck: Normal range of motion. Neck supple. No JVD present. No thyromegaly  present.  Cardiovascular: Normal rate, regular rhythm and normal heart sounds.  No murmur heard. No BLE edema. Pulmonary/Chest: Effort normal and breath sounds normal. No respiratory distress. She has no wheezes.   Skin: Skin is warm and dry. No rash noted. No erythema.  Psychiatric: She has a normal mood and affect. Her behavior is normal. Judgment and thought content normal.       Lab Results  Component Value Date   WBC 8.5 10/24/2009   HGB 13.9 10/24/2009   HCT 40.6 10/24/2009   PLT 242.0 10/24/2009   CHOL 197 07/21/2010   TRIG 237.0* 07/21/2010   HDL 44.70 07/21/2010   LDLDIRECT 116.7 07/21/2010   ALT 25 07/21/2010   AST 29 07/21/2010   NA 141 10/24/2009   K 3.8 10/24/2009   CL 105 10/24/2009   CREATININE 0.9 10/24/2009   BUN 19 10/24/2009   CO2 27 10/24/2009   TSH 1.32 10/24/2009   Lab Results  Component Value Date   VITAMINB12 202* 07/21/2010    Assessment & Plan:  See problem list. Medications and labs reviewed today.  Time spent with patient today 25 minutes, greater than 50% time spent counseling patient on immunizations, osteoporosis treatment, B12 defic and medication review. Also review of prior records

## 2011-01-12 NOTE — Assessment & Plan Note (Signed)
On statin - tolerating well The current medical regimen is effective;  continue present plan and medications.    

## 2011-01-12 NOTE — Assessment & Plan Note (Signed)
Poor tol of Boniva - on Actonel Reports DEXA improved 2012 since 2010 (done with gyn - Lomax) The current medical regimen is effective;  continue present plan and medications.

## 2011-01-13 ENCOUNTER — Encounter: Payer: Self-pay | Admitting: Internal Medicine

## 2011-02-03 ENCOUNTER — Encounter: Payer: Self-pay | Admitting: Internal Medicine

## 2011-02-08 ENCOUNTER — Ambulatory Visit (INDEPENDENT_AMBULATORY_CARE_PROVIDER_SITE_OTHER): Payer: BC Managed Care – PPO | Admitting: *Deleted

## 2011-02-08 DIAGNOSIS — Z2911 Encounter for prophylactic immunotherapy for respiratory syncytial virus (RSV): Secondary | ICD-10-CM | POA: Diagnosis not present

## 2011-02-08 DIAGNOSIS — E538 Deficiency of other specified B group vitamins: Secondary | ICD-10-CM | POA: Diagnosis not present

## 2011-02-08 DIAGNOSIS — Z23 Encounter for immunization: Secondary | ICD-10-CM

## 2011-02-08 MED ORDER — CYANOCOBALAMIN 1000 MCG/ML IJ SOLN
1000.0000 ug | Freq: Once | INTRAMUSCULAR | Status: AC
  Administered 2011-02-08: 1000 ug via INTRAMUSCULAR

## 2011-02-10 ENCOUNTER — Ambulatory Visit (INDEPENDENT_AMBULATORY_CARE_PROVIDER_SITE_OTHER): Payer: BC Managed Care – PPO | Admitting: Internal Medicine

## 2011-02-10 ENCOUNTER — Encounter: Payer: Self-pay | Admitting: Internal Medicine

## 2011-02-10 VITALS — BP 120/74 | HR 88 | Ht 61.0 in | Wt 137.6 lb

## 2011-02-10 DIAGNOSIS — Z1211 Encounter for screening for malignant neoplasm of colon: Secondary | ICD-10-CM

## 2011-02-10 DIAGNOSIS — K219 Gastro-esophageal reflux disease without esophagitis: Secondary | ICD-10-CM

## 2011-02-10 MED ORDER — PEG-KCL-NACL-NASULF-NA ASC-C 100 G PO SOLR: 1.0000 | Freq: Once | ORAL | Status: DC

## 2011-02-10 NOTE — Patient Instructions (Signed)
You have been scheduled for a colonoscopy. Please follow written instructions given to you at your visit today.  Please pick up your prep kit at the pharmacy within the next 2-3 days.  We have sent the following medications to your pharmacy for you to pick up at your convenience: moviprep, you were given instructions today at your appointment

## 2011-02-10 NOTE — Progress Notes (Signed)
Subjective:    Patient ID: Joann Peterson, female    DOB: 24-Nov-1945, 66 y.o.   MRN: [id number]  HPI [name] is a 66 yo female with PMH of HL, B12 def, osteoporosis who is seen in consultation at the request of Dr. Felicity Coyer for consideration of screening colonoscopy.  States that she has never had colonoscopy in the past. She has no family history of cortical cancer, though she thinks her father had colon polyps late in life. She is uncertain what type of polyps these were. She reports no GI complaints today other than very occasional lower GI gas and bloating. She denies abdominal pain. No nausea or vomiting. No trouble swallowing or odynophagia. Occasional heartburn. Good appetite. Stable weight. No constipation or diarrhea. No bright red blood per rectum or melena.  Review of Systems Constitutional: Negative for fever, chills, night sweats, activity change, appetite change and unexpected weight change HEENT: Negative for sore throat, mouth sores and trouble swallowing. Eyes: Negative for visual disturbance Respiratory: Negative for cough, chest tightness and shortness of breath Cardiovascular: Negative for chest pain, palpitations and lower extremity swelling Gastrointestinal: See history of present illness Genitourinary: Negative for dysuria and hematuria. Musculoskeletal: Negative for back pain, positive for chronic arthritis Skin: Negative for rash or color change Neurological: Negative for headaches, weakness, numbness Hematological: Negative for adenopathy, negative for easy bruising/bleeding Psychiatric/behavioral: Negative for depressed mood, negative for anxiety   Patient Active Problem List  Diagnoses  . HYPERLIPIDEMIA  . ALLERGIC RHINITIS  . ARTHRITIS  . SWELLING, LIMB  . OSTEOPOROSIS  . ABDOMINAL BLOATING  . B12 deficiency   Past Surgical History  Procedure Date  . No past surgeries    Family History  Problem Relation Age of Onset  . Arthritis Mother   .  Hyperlipidemia Mother   . Hypertension Mother   . Arthritis Father   . Hyperlipidemia Father   . Lung cancer Father   . Prostate cancer Father   . Colon polyps Father   . Colon cancer Neg Hx     Social History  . Marital Status: Married   Social History Main Topics  . Smoking status: Former Smoker    Quit date: 11/01/2008  . Smokeless tobacco: Never Used   Comment: Married, lives with spouse and g-son (raising him). Works as Electronics engineer at [employer]   . Alcohol Use: Yes     Rarely  . Drug Use: No      Objective:   Physical Exam BP 120/74  Pulse 88  Ht 5\' 1"  (1.549 m)  Wt 137 lb 9.6 oz (62.415 kg)  BMI 26.00 kg/m2 Constitutional: Well-developed and well-nourished. No distress. HEENT: Normocephalic and atraumatic. Oropharynx is clear and moist. No oropharyngeal exudate. Conjunctivae are normal. Pupils are equal round and reactive to light. No scleral icterus. Neck: Neck supple. Trachea midline. Cardiovascular: Normal rate, regular rhythm and intact distal pulses. No M/R/G Pulmonary/chest: Effort normal and breath sounds normal. No wheezing, rales or rhonchi. Abdominal: Soft, nontender, nondistended. Bowel sounds active throughout. There are no masses palpable. No hepatosplenomegaly. Extremities: no clubbing, cyanosis, or edema Lymphadenopathy: No cervical adenopathy noted. Neurological: Alert and oriented to person place and time. Skin: Skin is warm and dry. No rashes noted. Psychiatric: Normal mood and affect. Behavior is normal.     Assessment & Plan:  66 yo female seen in consultation to discuss screening colonoscopy.  1. Average risk screening -- the patient is felt to be average risk for colorectal cancer screening based on  her age she is certainly due for colonoscopy. We have discussed the risks benefits and alternatives at length today and she is willing to proceed with colonoscopy. We discussed the risk of bleeding, infection, missed lesions, trouble and  problems with sedation, perforation. We discussed alternatives including annual FOBT screening, CT colonography, and barium enema.  Again, she is willing and wants to proceed with colonoscopy.  This will be done with propofol at her request.  She will call us back to schedule the procedure.  2. GERD -- very rarely an issue for her. I recommended when necessary famotidine or ranitidine. No alarm symptoms.

## 2011-03-12 ENCOUNTER — Ambulatory Visit: Payer: BC Managed Care – PPO

## 2011-03-16 ENCOUNTER — Ambulatory Visit (INDEPENDENT_AMBULATORY_CARE_PROVIDER_SITE_OTHER): Payer: BC Managed Care – PPO | Admitting: *Deleted

## 2011-03-16 DIAGNOSIS — E538 Deficiency of other specified B group vitamins: Secondary | ICD-10-CM | POA: Diagnosis not present

## 2011-03-16 MED ORDER — CYANOCOBALAMIN 1000 MCG/ML IJ SOLN
1000.0000 ug | Freq: Once | INTRAMUSCULAR | Status: AC
  Administered 2011-03-16: 1000 ug via INTRAMUSCULAR

## 2011-03-25 ENCOUNTER — Encounter: Payer: Self-pay | Admitting: Internal Medicine

## 2011-03-25 ENCOUNTER — Ambulatory Visit (AMBULATORY_SURGERY_CENTER): Payer: BC Managed Care – PPO | Admitting: Internal Medicine

## 2011-03-25 DIAGNOSIS — R141 Gas pain: Secondary | ICD-10-CM | POA: Diagnosis not present

## 2011-03-25 DIAGNOSIS — F411 Generalized anxiety disorder: Secondary | ICD-10-CM | POA: Diagnosis not present

## 2011-03-25 DIAGNOSIS — R142 Eructation: Secondary | ICD-10-CM | POA: Diagnosis not present

## 2011-03-25 DIAGNOSIS — D126 Benign neoplasm of colon, unspecified: Secondary | ICD-10-CM | POA: Diagnosis not present

## 2011-03-25 DIAGNOSIS — K635 Polyp of colon: Secondary | ICD-10-CM

## 2011-03-25 DIAGNOSIS — Z1211 Encounter for screening for malignant neoplasm of colon: Secondary | ICD-10-CM | POA: Diagnosis not present

## 2011-03-25 DIAGNOSIS — D128 Benign neoplasm of rectum: Secondary | ICD-10-CM | POA: Diagnosis not present

## 2011-03-25 DIAGNOSIS — D129 Benign neoplasm of anus and anal canal: Secondary | ICD-10-CM

## 2011-03-25 HISTORY — PX: COLONOSCOPY: SHX174

## 2011-03-25 HISTORY — PX: POLYPECTOMY: SHX149

## 2011-03-25 MED ORDER — SODIUM CHLORIDE 0.9 % IV SOLN: 500.0000 mL | INTRAVENOUS | Status: DC

## 2011-03-25 NOTE — Patient Instructions (Signed)
Handouts on polyps and diverticulosis given. High fiber diet given. Resume previous medications.YOU HAD AN ENDOSCOPIC PROCEDURE TODAY AT THE [facility]: Refer to the procedure report that was given to you for any specific questions about what was found during the examination.  If the procedure report does not answer your questions, please call your gastroenterologist to clarify.  If you requested that your care partner not be given the details of your procedure findings, then the procedure report has been included in a sealed envelope for you to review at your convenience later.  YOU SHOULD EXPECT: Some feelings of bloating in the abdomen. Passage of more gas than usual.  Walking can help get rid of the air that was put into your GI tract during the procedure and reduce the bloating. If you had a lower endoscopy (such as a colonoscopy or flexible sigmoidoscopy) you may notice spotting of blood in your stool or on the toilet paper. If you underwent a bowel prep for your procedure, then you may not have a normal bowel movement for a few days.  DIET: Your first meal following the procedure should be a light meal and then it is ok to progress to your normal diet.  A half-sandwich or bowl of soup is an example of a good first meal.  Heavy or fried foods are harder to digest and may make you feel nauseous or bloated.  Likewise meals heavy in dairy and vegetables can cause extra gas to form and this can also increase the bloating.  Drink plenty of fluids but you should avoid alcoholic beverages for 24 hours.  ACTIVITY: Your care partner should take you home directly after the procedure.  You should plan to take it easy, moving slowly for the rest of the day.  You can resume normal activity the day after the procedure however you should NOT DRIVE or use heavy machinery for 24 hours (because of the sedation medicines used during the test).    SYMPTOMS TO REPORT IMMEDIATELY: A gastroenterologist can  be reached at any hour.  During normal business hours, 8:30 AM to 5:00 PM Monday through Friday, call [phone].  After hours and on weekends, please call the GI answering service at [phone] who will take a message and have the physician on call contact you.   Following lower endoscopy (colonoscopy or flexible sigmoidoscopy):  Excessive amounts of blood in the stool  Significant tenderness or worsening of abdominal pains  Swelling of the abdomen that is new, acute  Fever of 100F or higher  Following upper endoscopy (EGD)  Vomiting of blood or coffee ground material  New chest pain or pain under the shoulder blades  Painful or persistently difficult swallowing  New shortness of breath  Fever of 100F or higher  Black, tarry-looking stools  FOLLOW UP: If any biopsies were taken you will be contacted by phone or by letter within the next 1-3 weeks.  Call your gastroenterologist if you have not heard about the biopsies in 3 weeks.  Our staff will call the home number listed on your records the next business day following your procedure to check on you and address any questions or concerns that you may have at that time regarding the information given to you following your procedure. This is a courtesy call and so if there is no answer at the home number and we have not heard from you through the emergency physician on call, we will assume that you have returned to  your regular daily activities without incident.  SIGNATURES/CONFIDENTIALITY: You and/or your care partner have signed paperwork which will be entered into your electronic medical record.  These signatures attest to the fact that that the information above on your After Visit Summary has been reviewed and is understood.  Full responsibility of the confidentiality of this discharge information lies with you and/or your care-partner.

## 2011-03-25 NOTE — Op Note (Signed)
[facility] [address] [place], [place]  [address]  COLONOSCOPY PROCEDURE REPORT  PATIENT:  Denielle, Bayard  MR#:  [id number] BIRTHDATE:  [birth year], 65 yrs. old  GENDER:  female ENDOSCOPIST:  Carie Caddy. Pyrtle, MD REF. BY:  Rene Paci, M.D. PROCEDURE DATE:  03/25/2011 PROCEDURE:  Colonoscopy with snare polypectomy, Colon with cold biopsy polypectomy ASA CLASS:  Class II INDICATIONS:  Routine Risk Screening (1st colonoscopy) MEDICATIONS:   MAC sedation, administered by CRNA, propofol (Diprivan) 250 mg IV  DESCRIPTION OF PROCEDURE:   After the risks benefits and alternatives of the procedure were thoroughly explained, informed consent was obtained.  Digital rectal exam was performed and revealed no rectal masses.   The  endoscope was introduced through the anus and advanced to the cecum, which was identified by both the appendix and ileocecal valve, without limitations.  The quality of the prep was good, using MoviPrep.  The instrument was then slowly withdrawn as the colon was fully examined. <<PROCEDUREIMAGES>>  FINDINGS:  A 6 mm flat polyp was found in the cecum. Polyp was snared without cautery. Retrieval was successful.  A 6 mm sessile polyp was found in the ascending colon. Polyp was snared without cautery. Retrieval was successful. Two sessile polyps measuring 4 - 6 mm were found in the sigmoid colon. Polyps were snared without cautery. Retrieval was successful. There were multiple very small polyps identified in the rectum. Three polyps were removed using cold biopsy forceps as a representative sample as these are felt most likely hyperplastic polyps.  Moderate diverticulosis was found in the left colon.   Retroflexed views in the rectum revealed no abnormalities.    The scope was then withdrawn in minutes from the cecum and the procedure completed.  COMPLICATIONS:  None ENDOSCOPIC IMPRESSION: 1) Sessile polyp in the cecum. Removed and sent to  pathology. 2) Sessile polyp in the ascending colon. Removed and sent to pathology. 3) Two polyps in the sigmoid colon. Removed and sent to pathology. 4) Three very small rectal polyps removed from the rectum as a representative sample, and sent to pathology. 5) Moderate diverticulosis in the left colon  RECOMMENDATIONS: 1) Await pathology results 2) High fiber diet. 3) If the polyps removed today are proven to be adenomatous (pre-cancerous) polyps, you will need a colonoscopy in 3 years. Otherwise you should continue to follow colorectal cancer screening guidelines for "routine risk" patients with a colonoscopy in 10 years. You will receive a letter within 1-2 weeks with the results of your biopsy as well as final recommendations. Please call my office if you have not received a letter after 3 weeks. 4) You will receive a letter within 1-2 weeks with the results of your biopsy as well as final recommendations. Please call my office if you have not received a letter after 3 weeks.  Carie Caddy. Rhea Belton, MD  CC:  The Patient Newt Lukes, MD  n. Rosalie DoctorCarie Caddy. Pyrtle at 03/25/2011 11:01 AM  Particia Lather, [phone]

## 2011-03-25 NOTE — Progress Notes (Signed)
Patient did not experience any of the following events: a burn prior to discharge; a fall within the facility; wrong site/side/patient/procedure/implant event; or a hospital transfer or hospital admission upon discharge from the facility. (G8907) Patient did not have preoperative order for IV antibiotic SSI prophylaxis. (G8918)  

## 2011-03-26 ENCOUNTER — Telehealth: Payer: Self-pay | Admitting: *Deleted

## 2011-03-26 NOTE — Telephone Encounter (Signed)
  Follow up Call-  Call back number 03/25/2011  Post procedure Call Back phone  # [phone] cell ( not set up to receive messages) or [phone] work  Permission to leave phone message Yes     Patient questions:  Do you have a fever, pain , or abdominal swelling? no Pain Score  0 *  Have you tolerated food without any problems? yes  Have you been able to return to your normal activities? yes  Do you have any questions about your discharge instructions: Diet   no Medications  no Follow up visit  no  Do you have questions or concerns about your Care? no  Actions: * If pain score is 4 or above: No action needed, pain <4.

## 2011-04-02 ENCOUNTER — Encounter: Payer: Self-pay | Admitting: Internal Medicine

## 2011-04-13 ENCOUNTER — Ambulatory Visit (INDEPENDENT_AMBULATORY_CARE_PROVIDER_SITE_OTHER): Payer: BC Managed Care – PPO | Admitting: *Deleted

## 2011-04-13 DIAGNOSIS — E538 Deficiency of other specified B group vitamins: Secondary | ICD-10-CM | POA: Diagnosis not present

## 2011-04-13 MED ORDER — CYANOCOBALAMIN 1000 MCG/ML IJ SOLN
1000.0000 ug | Freq: Once | INTRAMUSCULAR | Status: AC
  Administered 2011-04-13: 1000 ug via INTRAMUSCULAR

## 2011-04-15 ENCOUNTER — Ambulatory Visit: Payer: BC Managed Care – PPO

## 2011-04-26 ENCOUNTER — Other Ambulatory Visit: Payer: Self-pay | Admitting: Internal Medicine

## 2011-05-13 ENCOUNTER — Ambulatory Visit (INDEPENDENT_AMBULATORY_CARE_PROVIDER_SITE_OTHER): Payer: BC Managed Care – PPO | Admitting: *Deleted

## 2011-05-13 DIAGNOSIS — E538 Deficiency of other specified B group vitamins: Secondary | ICD-10-CM | POA: Diagnosis not present

## 2011-05-13 MED ORDER — CYANOCOBALAMIN 1000 MCG/ML IJ SOLN
1000.0000 ug | Freq: Once | INTRAMUSCULAR | Status: AC
  Administered 2011-05-13: 1000 ug via INTRAMUSCULAR

## 2011-06-09 ENCOUNTER — Ambulatory Visit (INDEPENDENT_AMBULATORY_CARE_PROVIDER_SITE_OTHER): Payer: Medicare Other | Admitting: *Deleted

## 2011-06-09 DIAGNOSIS — E538 Deficiency of other specified B group vitamins: Secondary | ICD-10-CM

## 2011-06-09 MED ORDER — CYANOCOBALAMIN 1000 MCG/ML IJ SOLN
1000.0000 ug | Freq: Once | INTRAMUSCULAR | Status: AC
  Administered 2011-06-09: 1000 ug via INTRAMUSCULAR

## 2011-06-15 DIAGNOSIS — L600 Ingrowing nail: Secondary | ICD-10-CM | POA: Diagnosis not present

## 2011-06-15 DIAGNOSIS — L608 Other nail disorders: Secondary | ICD-10-CM | POA: Diagnosis not present

## 2011-07-14 ENCOUNTER — Encounter: Payer: Self-pay | Admitting: Internal Medicine

## 2011-07-14 ENCOUNTER — Ambulatory Visit (INDEPENDENT_AMBULATORY_CARE_PROVIDER_SITE_OTHER): Payer: Medicare Other | Admitting: Internal Medicine

## 2011-07-14 VITALS — BP 120/68 | HR 83 | Temp 97.2°F | Ht 61.0 in | Wt 141.0 lb

## 2011-07-14 DIAGNOSIS — Z79899 Other long term (current) drug therapy: Secondary | ICD-10-CM | POA: Diagnosis not present

## 2011-07-14 DIAGNOSIS — E538 Deficiency of other specified B group vitamins: Secondary | ICD-10-CM | POA: Diagnosis not present

## 2011-07-14 DIAGNOSIS — E785 Hyperlipidemia, unspecified: Secondary | ICD-10-CM

## 2011-07-14 DIAGNOSIS — J309 Allergic rhinitis, unspecified: Secondary | ICD-10-CM | POA: Diagnosis not present

## 2011-07-14 MED ORDER — CYANOCOBALAMIN 1000 MCG/ML IJ SOLN
1000.0000 ug | Freq: Once | INTRAMUSCULAR | Status: AC
  Administered 2011-07-14: 1000 ug via INTRAMUSCULAR

## 2011-07-14 MED ORDER — FLUTICASONE PROPIONATE 50 MCG/ACT NA SUSP: 2.0000 | Freq: Every day | NASAL | Status: DC

## 2011-07-14 MED ORDER — SIMVASTATIN 40 MG PO TABS: 40.0000 mg | ORAL_TABLET | Freq: Every day | ORAL | Status: DC

## 2011-07-14 NOTE — Progress Notes (Signed)
  Subjective:    Patient ID: Joann Peterson, female    DOB: [birth year], 66 y.o.   MRN: [id number]  HPI  Here for follow up - reviewed chronic medical issues today:  dyslipidemia - reports compliance with ongoing medical treatment and no changes in medication dose or frequency. denies adverse side effects related to current therapy.     allergic rhinitis - chronic sinus drainage - pressure but no pain, no fever but thick nasal dc down throat each am - no sneezing - headache with weather change - uses OTC meds as needed -some relief, ?change nasal steroid to generic   osteoporosis - dx DEXA 2010 > repeat in 2012 was improved (followed by gyn - Lomax) - on actonel, prev changed to boniva by insurance pref in 2011 - no bone pain or falls, no trouble swallowing or jaw pain - felt boniva caused more reflux symptoms and heartburn than actonel    osteoarthritis - diffuse in hands, esp left thumb, wrists, elbows and neck  B12 deficiency - dx 07/2010 - on B12 IM replacement for same - no improvement in numbness or fatigue   Past Medical History  Diagnosis Date  . ALLERGIC RHINITIS   . OA (osteoarthritis)     Left thumb, hand and knees  . GERD (gastroesophageal reflux disease)   . HYPERLIPIDEMIA   . OSTEOPOROSIS   . B12 nutritional deficiency dx 07/2010    Review of Systems  Constitutional: Negative for fever and unexpected weight change.  Respiratory: Negative for shortness of breath and wheezing.   Neurological: Positive for numbness (persisting B hands). Negative for weakness and headaches.       Objective:   Physical Exam  BP 120/68  Pulse 83  Temp 97.2 F (36.2 C) (Oral)  Ht 5\' 1"  (1.549 m)  Wt 141 lb (63.957 kg)  BMI 26.64 kg/m2  SpO2 95% Wt Readings from Last 3 Encounters:  07/14/11 141 lb (63.957 kg)  03/25/11 137 lb (62.143 kg)  02/10/11 137 lb 9.6 oz (62.415 kg)   Constitutional: She appears well-developed and well-nourished. No distress.  Neck: Normal range of motion.  Neck supple. No JVD present. No thyromegaly present.  Cardiovascular: Normal rate, regular rhythm and normal heart sounds.  No murmur heard. No BLE edema. Pulmonary/Chest: Effort normal and breath sounds normal. No respiratory distress. She has no wheezes.   Skin: Skin is warm and dry. No rash noted. No erythema.  Psychiatric: She has a normal mood and affect. Her behavior is normal. Judgment and thought content normal.       Lab Results  Component Value Date   WBC 8.5 10/24/2009   HGB 13.9 10/24/2009   HCT 40.6 10/24/2009   PLT 242.0 10/24/2009   CHOL 197 07/21/2010   TRIG 237.0* 07/21/2010   HDL 44.70 07/21/2010   LDLDIRECT 116.7 07/21/2010   ALT 25 07/21/2010   AST 29 07/21/2010   NA 141 10/24/2009   K 3.8 10/24/2009   CL 105 10/24/2009   CREATININE 0.9 10/24/2009   BUN 19 10/24/2009   CO2 27 10/24/2009   TSH 1.32 10/24/2009   Lab Results  Component Value Date   VITAMINB12 202* 07/21/2010    Assessment & Plan:  See problem list. Medications and labs reviewed today.

## 2011-07-14 NOTE — Patient Instructions (Signed)
It was good to see you today. Medications reviewed, no changes at this time. Ok to take B12 pills as discussed in addition to "B12 shots" each month Test(s) ordered today. Your results will be called to you after review (48-72hours after test completion). If any changes need to be made, you will be notified at that time. Please schedule followup in 6 months, call sooner if problems.

## 2011-07-14 NOTE — Assessment & Plan Note (Signed)
Dx 07/2010 - on IM monthly replacement for same Advised ok to take oral as well if desired Lab Results  Component Value Date   VITAMINB12 202* 07/21/2010   

## 2011-07-14 NOTE — Assessment & Plan Note (Signed)
On statin - tolerating well The current medical regimen is effective;  continue present plan and medications.    

## 2011-07-14 NOTE — Assessment & Plan Note (Signed)
OK to try OTC allegra prn,  Will change nasonex to generic flonase for & - new rx provided to f/u any worsening symptoms or concerns

## 2011-07-16 ENCOUNTER — Other Ambulatory Visit (INDEPENDENT_AMBULATORY_CARE_PROVIDER_SITE_OTHER): Payer: Medicare Other

## 2011-07-16 DIAGNOSIS — Z79899 Other long term (current) drug therapy: Secondary | ICD-10-CM

## 2011-07-16 DIAGNOSIS — E785 Hyperlipidemia, unspecified: Secondary | ICD-10-CM | POA: Diagnosis not present

## 2011-07-16 LAB — LIPID PANEL
Cholesterol: 157 mg/dL (ref 0–200)
HDL: 41.3 mg/dL (ref >39.00)

## 2011-07-16 LAB — HEPATIC FUNCTION PANEL
ALT: 27 U/L (ref 0–35)
AST: 31 U/L (ref 0–37)
Bilirubin, Direct: 0.1 mg/dL (ref 0.0–0.3)
Total Bilirubin: 1.1 mg/dL (ref 0.3–1.2)
Total Protein: 6.9 g/dL (ref 6.0–8.3)

## 2011-08-13 ENCOUNTER — Ambulatory Visit (INDEPENDENT_AMBULATORY_CARE_PROVIDER_SITE_OTHER): Payer: Medicare Other

## 2011-08-13 DIAGNOSIS — E538 Deficiency of other specified B group vitamins: Secondary | ICD-10-CM | POA: Diagnosis not present

## 2011-08-13 MED ORDER — CYANOCOBALAMIN 1000 MCG/ML IJ SOLN
1000.0000 ug | Freq: Once | INTRAMUSCULAR | Status: AC
  Administered 2011-08-13: 1000 ug via INTRAMUSCULAR

## 2011-09-09 ENCOUNTER — Telehealth: Payer: Self-pay | Admitting: Internal Medicine

## 2011-09-09 MED ORDER — CYANOCOBALAMIN 1000 MCG/ML IJ SOLN: 1000.0000 ug | INTRAMUSCULAR | Status: DC

## 2011-09-09 NOTE — Telephone Encounter (Signed)
The patient is hoping to get a rx for her monthly b-12 inj.  She wants to pick it up at her pharmacy and then get the shot here.  Thanks!

## 2011-09-09 NOTE — Telephone Encounter (Signed)
Notified pt rx sent to pharmacy... 09/09/11@9 :09am/LMB

## 2011-09-14 ENCOUNTER — Ambulatory Visit (INDEPENDENT_AMBULATORY_CARE_PROVIDER_SITE_OTHER): Payer: Medicare Other

## 2011-09-14 ENCOUNTER — Ambulatory Visit: Payer: Medicare Other

## 2011-09-14 DIAGNOSIS — E538 Deficiency of other specified B group vitamins: Secondary | ICD-10-CM

## 2011-09-14 MED ORDER — CYANOCOBALAMIN 1000 MCG/ML IJ SOLN
1000.0000 ug | Freq: Once | INTRAMUSCULAR | Status: AC
  Administered 2011-09-14: 1000 ug via INTRAMUSCULAR

## 2011-10-08 ENCOUNTER — Other Ambulatory Visit: Payer: Self-pay | Admitting: Gynecology

## 2011-10-08 DIAGNOSIS — Z1231 Encounter for screening mammogram for malignant neoplasm of breast: Secondary | ICD-10-CM

## 2011-10-15 ENCOUNTER — Ambulatory Visit (INDEPENDENT_AMBULATORY_CARE_PROVIDER_SITE_OTHER): Payer: Medicare Other

## 2011-10-15 DIAGNOSIS — Z23 Encounter for immunization: Secondary | ICD-10-CM

## 2011-10-15 DIAGNOSIS — E538 Deficiency of other specified B group vitamins: Secondary | ICD-10-CM

## 2011-10-15 MED ORDER — CYANOCOBALAMIN 1000 MCG/ML IJ SOLN
1000.0000 ug | Freq: Once | INTRAMUSCULAR | Status: AC
  Administered 2011-10-15: 1000 ug via INTRAMUSCULAR

## 2011-11-15 ENCOUNTER — Ambulatory Visit (INDEPENDENT_AMBULATORY_CARE_PROVIDER_SITE_OTHER): Payer: Medicare Other

## 2011-11-15 ENCOUNTER — Ambulatory Visit
Admission: RE | Admit: 2011-11-15 | Discharge: 2011-11-15 | Disposition: A | Discharge status: Home / Self Care | Payer: Medicare Other | Source: Ambulatory Visit | Attending: Gynecology | Admitting: Gynecology

## 2011-11-15 DIAGNOSIS — E538 Deficiency of other specified B group vitamins: Secondary | ICD-10-CM

## 2011-11-15 DIAGNOSIS — Z1231 Encounter for screening mammogram for malignant neoplasm of breast: Secondary | ICD-10-CM

## 2011-11-15 MED ORDER — CYANOCOBALAMIN 1000 MCG/ML IJ SOLN
1000.0000 ug | Freq: Once | INTRAMUSCULAR | Status: AC
  Administered 2011-11-15: 1000 ug via INTRAMUSCULAR

## 2011-12-13 ENCOUNTER — Other Ambulatory Visit: Payer: Self-pay | Admitting: Gynecology

## 2011-12-13 DIAGNOSIS — E2839 Other primary ovarian failure: Secondary | ICD-10-CM | POA: Diagnosis not present

## 2011-12-13 DIAGNOSIS — M899 Disorder of bone, unspecified: Secondary | ICD-10-CM | POA: Diagnosis not present

## 2011-12-13 DIAGNOSIS — Z01419 Encounter for gynecological examination (general) (routine) without abnormal findings: Secondary | ICD-10-CM | POA: Diagnosis not present

## 2011-12-13 DIAGNOSIS — M949 Disorder of cartilage, unspecified: Secondary | ICD-10-CM | POA: Diagnosis not present

## 2011-12-15 ENCOUNTER — Ambulatory Visit: Payer: Medicare Other

## 2011-12-15 ENCOUNTER — Ambulatory Visit (INDEPENDENT_AMBULATORY_CARE_PROVIDER_SITE_OTHER): Payer: Medicare Other

## 2011-12-15 DIAGNOSIS — E538 Deficiency of other specified B group vitamins: Secondary | ICD-10-CM

## 2011-12-15 MED ORDER — CYANOCOBALAMIN 1000 MCG/ML IJ SOLN
1000.0000 ug | Freq: Once | INTRAMUSCULAR | Status: AC
  Administered 2011-12-15: 1000 ug via INTRAMUSCULAR

## 2012-01-13 ENCOUNTER — Encounter: Payer: Self-pay | Admitting: Internal Medicine

## 2012-01-13 ENCOUNTER — Ambulatory Visit (INDEPENDENT_AMBULATORY_CARE_PROVIDER_SITE_OTHER): Payer: Medicare Other | Admitting: Internal Medicine

## 2012-01-13 VITALS — BP 128/80 | HR 71 | Temp 96.8°F | Ht 61.0 in | Wt 139.4 lb

## 2012-01-13 DIAGNOSIS — M810 Age-related osteoporosis without current pathological fracture: Secondary | ICD-10-CM | POA: Diagnosis not present

## 2012-01-13 DIAGNOSIS — E785 Hyperlipidemia, unspecified: Secondary | ICD-10-CM | POA: Diagnosis not present

## 2012-01-13 DIAGNOSIS — E538 Deficiency of other specified B group vitamins: Secondary | ICD-10-CM

## 2012-01-13 MED ORDER — BLACK COHOSH 40 MG PO CAPS: 40.0000 mg | ORAL_CAPSULE | Freq: Two times a day (BID) | ORAL | Status: DC

## 2012-01-13 MED ORDER — CYANOCOBALAMIN 1000 MCG/ML IJ SOLN
1000.0000 ug | Freq: Once | INTRAMUSCULAR | Status: AC
  Administered 2012-01-13: 1000 ug via INTRAMUSCULAR

## 2012-01-13 NOTE — Assessment & Plan Note (Signed)
On statin - tolerating well The current medical regimen is effective;  continue present plan and medications.

## 2012-01-13 NOTE — Patient Instructions (Addendum)
It was good to see you today. We have reviewed your prior records including labs and tests today we will send to your gynecologist for "release of records" as discussed today -  Will consider Evista or Prolia for bones if needed - ok to stop Actonel Try black cohosh for hot flashes Please schedule followup in 6 months for cholesterol check, call sooner if problems.

## 2012-01-13 NOTE — Progress Notes (Signed)
  Subjective:    Patient ID: Joann Peterson, female    DOB: 11-06-1945, 66 y.o.   MRN: [id number]  HPI  Here for follow up - reviewed chronic medical issues today:  dyslipidemia - reports compliance with ongoing medical treatment and no changes in medication dose or frequency. denies adverse side effects related to current therapy.     osteoporosis - dx DEXA 2010 > repeat in 2012 was improved (followed by gyn - Lomax) - on actonel but complains of trouble swallowing? recommended drug holiday by gyn; prev on boniva in 2011 - no bone pain or falls, no jaw pain - ?prolia option or other med   osteoarthritis - diffuse in hands, esp left thumb, wrists, elbows and neck  B12 deficiency - dx 07/2010 - on B12 IM replacement for same - no improvement in numbness or fatigue   Past Medical History  Diagnosis Date  . ALLERGIC RHINITIS   . OA (osteoarthritis)     Left thumb, hand and knees  . GERD (gastroesophageal reflux disease)   . HYPERLIPIDEMIA   . OSTEOPOROSIS   . B12 nutritional deficiency dx 07/2010    Review of Systems  Constitutional: Negative for fever and unexpected weight change.  Respiratory: Negative for shortness of breath and wheezing.   Neurological: Positive for numbness (persisting B hands). Negative for weakness and headaches.       Objective:   Physical Exam  BP 128/80  Pulse 71  Temp 96.8 F (36 C) (Oral)  Ht 5\' 1"  (1.549 m)  Wt 139 lb 6.4 oz (63.231 kg)  BMI 26.34 kg/m2  SpO2 97% Wt Readings from Last 3 Encounters:  01/13/12 139 lb 6.4 oz (63.231 kg)  07/14/11 141 lb (63.957 kg)  03/25/11 137 lb (62.143 kg)   Constitutional: She appears well-developed and well-nourished. No distress.  Neck: Normal range of motion. Neck supple. No JVD present. No thyromegaly present.  Cardiovascular: Normal rate, regular rhythm and normal heart sounds.  No murmur heard. No BLE edema. Pulmonary/Chest: Effort normal and breath sounds normal. No respiratory distress. She has no  wheezes.   Psychiatric: She has a normal mood and affect. Her behavior is normal. Judgment and thought content normal.      Lab Results  Component Value Date   WBC 8.5 10/24/2009   HGB 13.9 10/24/2009   HCT 40.6 10/24/2009   PLT 242.0 10/24/2009   CHOL 157 07/16/2011   TRIG 191.0* 07/16/2011   HDL 41.30 07/16/2011   LDLDIRECT 116.7 07/21/2010   ALT 27 07/16/2011   AST 31 07/16/2011   NA 141 10/24/2009   K 3.8 10/24/2009   CL 105 10/24/2009   CREATININE 0.9 10/24/2009   BUN 19 10/24/2009   CO2 27 10/24/2009   TSH 1.32 10/24/2009   Lab Results  Component Value Date   VITAMINB12 202* 07/21/2010    Assessment & Plan:  See problem list. Medications and labs reviewed today.  Time spent with pt today 25 minutes, greater than 50% time spent counseling patient on osteopenia and medication review. Also emotional support - mom actively dying with home hospice

## 2012-01-13 NOTE — Assessment & Plan Note (Addendum)
Poor tol of Boniva in 2011 - on Actonel, but ?increase dysphagia recommended drug holiday by gyn due to same - ?other med like Prolia Reports DEXA improved 2012 since 2010 (done with gyn - Lomax) - will send for ROI Will consider Prolia or Evista or other med if needed, ok for bisphos holiday now

## 2012-01-13 NOTE — Assessment & Plan Note (Signed)
Dx 07/2010 - on IM monthly replacement for same Advised ok to take oral as well if desired Lab Results  Component Value Date   VITAMINB12 202* 07/21/2010   

## 2012-01-14 ENCOUNTER — Ambulatory Visit: Payer: Medicare Other

## 2012-02-14 ENCOUNTER — Ambulatory Visit (INDEPENDENT_AMBULATORY_CARE_PROVIDER_SITE_OTHER): Payer: Medicare Other | Admitting: *Deleted

## 2012-02-14 DIAGNOSIS — E538 Deficiency of other specified B group vitamins: Secondary | ICD-10-CM | POA: Diagnosis not present

## 2012-02-14 MED ORDER — CYANOCOBALAMIN 1000 MCG/ML IJ SOLN
1000.0000 ug | Freq: Once | INTRAMUSCULAR | Status: AC
  Administered 2012-02-14: 1000 ug via INTRAMUSCULAR

## 2012-02-14 MED ORDER — SIMVASTATIN 40 MG PO TABS: 40.0000 mg | ORAL_TABLET | Freq: Every day | ORAL | Status: DC

## 2012-03-14 ENCOUNTER — Ambulatory Visit (INDEPENDENT_AMBULATORY_CARE_PROVIDER_SITE_OTHER): Payer: Medicare Other | Admitting: *Deleted

## 2012-03-14 DIAGNOSIS — E538 Deficiency of other specified B group vitamins: Secondary | ICD-10-CM | POA: Diagnosis not present

## 2012-03-14 MED ORDER — CYANOCOBALAMIN 1000 MCG/ML IJ SOLN
1000.0000 ug | Freq: Once | INTRAMUSCULAR | Status: AC
  Administered 2012-03-14: 1000 ug via INTRAMUSCULAR

## 2012-03-16 ENCOUNTER — Ambulatory Visit: Payer: Medicare Other

## 2012-04-11 ENCOUNTER — Ambulatory Visit (INDEPENDENT_AMBULATORY_CARE_PROVIDER_SITE_OTHER): Payer: Medicare Other | Admitting: *Deleted

## 2012-04-11 DIAGNOSIS — E538 Deficiency of other specified B group vitamins: Secondary | ICD-10-CM | POA: Diagnosis not present

## 2012-04-11 MED ORDER — CYANOCOBALAMIN 1000 MCG/ML IJ SOLN
1000.0000 ug | Freq: Once | INTRAMUSCULAR | Status: AC
  Administered 2012-04-11: 1000 ug via INTRAMUSCULAR

## 2012-05-11 ENCOUNTER — Ambulatory Visit (INDEPENDENT_AMBULATORY_CARE_PROVIDER_SITE_OTHER): Payer: Medicare Other | Admitting: *Deleted

## 2012-05-11 DIAGNOSIS — E538 Deficiency of other specified B group vitamins: Secondary | ICD-10-CM | POA: Diagnosis not present

## 2012-05-11 MED ORDER — CYANOCOBALAMIN 1000 MCG/ML IJ SOLN
1000.0000 ug | Freq: Once | INTRAMUSCULAR | Status: AC
  Administered 2012-05-11: 1000 ug via INTRAMUSCULAR

## 2012-06-12 ENCOUNTER — Ambulatory Visit (INDEPENDENT_AMBULATORY_CARE_PROVIDER_SITE_OTHER): Payer: Medicare Other | Admitting: *Deleted

## 2012-06-12 DIAGNOSIS — E538 Deficiency of other specified B group vitamins: Secondary | ICD-10-CM

## 2012-06-12 MED ORDER — CYANOCOBALAMIN 1000 MCG/ML IJ SOLN
1000.0000 ug | Freq: Once | INTRAMUSCULAR | Status: AC
  Administered 2012-06-12: 1000 ug via INTRAMUSCULAR

## 2012-07-13 ENCOUNTER — Ambulatory Visit (INDEPENDENT_AMBULATORY_CARE_PROVIDER_SITE_OTHER): Payer: Medicare Other

## 2012-07-13 DIAGNOSIS — E538 Deficiency of other specified B group vitamins: Secondary | ICD-10-CM

## 2012-07-13 MED ORDER — CYANOCOBALAMIN 1000 MCG/ML IJ SOLN
1000.0000 ug | Freq: Once | INTRAMUSCULAR | Status: AC
  Administered 2012-07-13: 1000 ug via INTRAMUSCULAR

## 2012-07-31 ENCOUNTER — Encounter: Payer: Self-pay | Admitting: Internal Medicine

## 2012-07-31 ENCOUNTER — Ambulatory Visit (INDEPENDENT_AMBULATORY_CARE_PROVIDER_SITE_OTHER): Payer: Medicare Other | Admitting: Internal Medicine

## 2012-07-31 ENCOUNTER — Other Ambulatory Visit (INDEPENDENT_AMBULATORY_CARE_PROVIDER_SITE_OTHER): Payer: Medicare Other

## 2012-07-31 VITALS — BP 120/78 | HR 76 | Temp 97.6°F | Wt 143.4 lb

## 2012-07-31 DIAGNOSIS — E785 Hyperlipidemia, unspecified: Secondary | ICD-10-CM | POA: Diagnosis not present

## 2012-07-31 DIAGNOSIS — Z6825 Body mass index (BMI) 25.0-25.9, adult: Secondary | ICD-10-CM

## 2012-07-31 DIAGNOSIS — E663 Overweight: Secondary | ICD-10-CM | POA: Diagnosis not present

## 2012-07-31 DIAGNOSIS — M810 Age-related osteoporosis without current pathological fracture: Secondary | ICD-10-CM | POA: Diagnosis not present

## 2012-07-31 DIAGNOSIS — R635 Abnormal weight gain: Secondary | ICD-10-CM

## 2012-07-31 DIAGNOSIS — Z23 Encounter for immunization: Secondary | ICD-10-CM

## 2012-07-31 LAB — LIPID PANEL
Cholesterol: 193 mg/dL (ref 0–200)
VLDL: 72 mg/dL — ABNORMAL HIGH (ref 0.0–40.0)

## 2012-07-31 MED ORDER — FLUTICASONE PROPIONATE 50 MCG/ACT NA SUSP: 2.0000 | Freq: Every day | NASAL | Status: DC

## 2012-07-31 MED ORDER — PHENTERMINE HCL 37.5 MG PO CAPS: 37.5000 mg | ORAL_CAPSULE | ORAL | Status: DC

## 2012-07-31 MED ORDER — SIMVASTATIN 40 MG PO TABS: 40.0000 mg | ORAL_TABLET | Freq: Every day | ORAL | Status: DC

## 2012-07-31 NOTE — Addendum Note (Signed)
Addended by: Deatra James on: 07/31/2012 10:00 AM   Modules accepted: Orders

## 2012-07-31 NOTE — Assessment & Plan Note (Signed)
On statin - tolerating well Check annually - titrate as needed The current medical regimen is effective;  continue present plan and medications.   

## 2012-07-31 NOTE — Assessment & Plan Note (Signed)
Poor tol of Boniva in 2011 - on Actonel, but stopped by gyn fall 2013 due to dysphagia Will recheck DEXA 01/2013- if progression of dz off med, will consider other med like Prolia Calcium/Vit D caused constipation - encouraged dietary intake as tolerated

## 2012-07-31 NOTE — Progress Notes (Signed)
  Subjective:    Patient ID: Joann Peterson, female    DOB: Jun 05, 1945, 67 y.o.   MRN: [id number]  HPI  Here for follow up - reviewed chronic medical issues today:  dyslipidemia - reports compliance with ongoing medical treatment and no changes in medication dose or frequency. denies adverse side effects related to current therapy.     osteoporosis - dx DEXA 2010 > repeat in 2012 improved (followed by gyn - Lomax) - prev on actonel but holding same since fall 2013 due to dysphasia; also prev on boniva in 2011 - no bone pain or falls, no jaw pain - ?prolia option or other med   osteoarthritis - diffuse in hands, esp left thumb, wrists, elbows and neck  B12 deficiency - dx 07/2010 - on B12 IM replacement for same - no improvement in numbness or fatigue   Past Medical History  Diagnosis Date  . ALLERGIC RHINITIS   . OA (osteoarthritis)     Left thumb, hand and knees  . GERD (gastroesophageal reflux disease)   . HYPERLIPIDEMIA   . OSTEOPOROSIS   . B12 nutritional deficiency dx 07/2010    Review of Systems  Constitutional: Positive for unexpected weight change. Negative for fever.  Respiratory: Negative for shortness of breath and wheezing.   Neurological: Positive for numbness (persisting B hands). Negative for weakness and headaches.       Objective:   Physical Exam  BP 120/78  Pulse 76  Temp(Src) 97.6 F (36.4 C) (Oral)  Wt 143 lb 6.4 oz (65.046 kg)  BMI 27.11 kg/m2  SpO2 93% Wt Readings from Last 3 Encounters:  07/31/12 143 lb 6.4 oz (65.046 kg)  01/13/12 139 lb 6.4 oz (63.231 kg)  07/14/11 141 lb (63.957 kg)   Constitutional: She appears well-developed and well-nourished. No distress.  Neck: Normal range of motion. Neck supple. No JVD present. No thyromegaly present.  Cardiovascular: Normal rate, regular rhythm and normal heart sounds.  No murmur heard. No BLE edema. Pulmonary/Chest: Effort normal and breath sounds normal. No respiratory distress. She has no wheezes.    Psychiatric: She has a normal mood and affect. Her behavior is normal. Judgment and thought content normal.      Lab Results  Component Value Date   WBC 8.5 10/24/2009   HGB 13.9 10/24/2009   HCT 40.6 10/24/2009   PLT 242.0 10/24/2009   CHOL 157 07/16/2011   TRIG 191.0* 07/16/2011   HDL 41.30 07/16/2011   LDLDIRECT 116.7 07/21/2010   ALT 27 07/16/2011   AST 31 07/16/2011   NA 141 10/24/2009   K 3.8 10/24/2009   CL 105 10/24/2009   CREATININE 0.9 10/24/2009   BUN 19 10/24/2009   CO2 27 10/24/2009   TSH 1.32 10/24/2009   Lab Results  Component Value Date   VITAMINB12 202* 07/21/2010    Assessment & Plan:  See problem list. Medications and labs reviewed today.  Overweight - BMI>27 - increasing trend despite diet/exercise lifestyle changes - check screening labs today namely TSH, again low-dose phentermine as appetite suppressant. Recheck weight with nurse visit in 30 days, will not continue prescription therapy after BMI under 27

## 2012-07-31 NOTE — Patient Instructions (Addendum)
It was good to see you today. We have reviewed your prior records including labs and tests today Test(s) ordered today. Your results will be released to MyChart (or called to you) after review, usually within 72hours after test completion. If any changes need to be made, you will be notified at that same time. Will recheck DEXA bine density in 01/2013 (or later) - then will consider Prolia for bones if progressive loss off Actonel Pneumonia vaccine updated today Will use Phentermine to help you reach your weight loss goals - today prescription for 1st of 3 months provided - If side effects or other problems, please stop medication and call us. Please return in 1 month (nurse visit for weight check) before refill will be given Once BMI <27, we will discontinue phentermine Please schedule followup in 6 months for cholesterol and weight check, call sooner if problems.

## 2012-08-14 ENCOUNTER — Ambulatory Visit (INDEPENDENT_AMBULATORY_CARE_PROVIDER_SITE_OTHER): Payer: Medicare Other | Admitting: *Deleted

## 2012-08-14 DIAGNOSIS — E538 Deficiency of other specified B group vitamins: Secondary | ICD-10-CM

## 2012-08-14 MED ORDER — CYANOCOBALAMIN 1000 MCG/ML IJ SOLN
1000.0000 ug | Freq: Once | INTRAMUSCULAR | Status: AC
  Administered 2012-08-14: 1000 ug via INTRAMUSCULAR

## 2012-09-18 ENCOUNTER — Ambulatory Visit (INDEPENDENT_AMBULATORY_CARE_PROVIDER_SITE_OTHER): Payer: Medicare Other | Admitting: *Deleted

## 2012-09-18 DIAGNOSIS — E538 Deficiency of other specified B group vitamins: Secondary | ICD-10-CM | POA: Diagnosis not present

## 2012-09-18 MED ORDER — CYANOCOBALAMIN 1000 MCG/ML IJ SOLN
1000.0000 ug | Freq: Once | INTRAMUSCULAR | Status: AC
  Administered 2012-09-18: 1000 ug via INTRAMUSCULAR

## 2012-10-12 ENCOUNTER — Other Ambulatory Visit: Payer: Self-pay

## 2012-10-12 DIAGNOSIS — Z1231 Encounter for screening mammogram for malignant neoplasm of breast: Secondary | ICD-10-CM

## 2012-10-19 ENCOUNTER — Ambulatory Visit (INDEPENDENT_AMBULATORY_CARE_PROVIDER_SITE_OTHER): Payer: Medicare Other | Admitting: *Deleted

## 2012-10-19 DIAGNOSIS — E538 Deficiency of other specified B group vitamins: Secondary | ICD-10-CM

## 2012-10-19 MED ORDER — CYANOCOBALAMIN 1000 MCG/ML IJ SOLN
1000.0000 ug | Freq: Once | INTRAMUSCULAR | Status: AC
  Administered 2012-10-19: 1000 ug via INTRAMUSCULAR

## 2012-11-17 ENCOUNTER — Ambulatory Visit
Admission: RE | Admit: 2012-11-17 | Discharge: 2012-11-17 | Disposition: A | Discharge status: Home / Self Care | Payer: Medicare Other | Source: Ambulatory Visit

## 2012-11-17 DIAGNOSIS — Z1231 Encounter for screening mammogram for malignant neoplasm of breast: Secondary | ICD-10-CM | POA: Diagnosis not present

## 2012-11-20 ENCOUNTER — Ambulatory Visit (INDEPENDENT_AMBULATORY_CARE_PROVIDER_SITE_OTHER): Payer: Medicare Other | Admitting: Internal Medicine

## 2012-11-20 ENCOUNTER — Ambulatory Visit: Payer: Medicare Other

## 2012-11-20 ENCOUNTER — Encounter: Payer: Self-pay | Admitting: Internal Medicine

## 2012-11-20 VITALS — BP 120/82 | HR 63 | Temp 97.5°F

## 2012-11-20 DIAGNOSIS — E538 Deficiency of other specified B group vitamins: Secondary | ICD-10-CM

## 2012-11-20 DIAGNOSIS — J069 Acute upper respiratory infection, unspecified: Secondary | ICD-10-CM | POA: Diagnosis not present

## 2012-11-20 MED ORDER — CYANOCOBALAMIN 1000 MCG/ML IJ SOLN
1000.0000 ug | Freq: Once | INTRAMUSCULAR | Status: AC
  Administered 2012-11-20: 1000 ug via INTRAMUSCULAR

## 2012-11-20 NOTE — Progress Notes (Signed)
  Subjective:    Patient ID: Joann Peterson, female    DOB: 1946-01-27, 68 y.o.   MRN: [id number]  Cough This is a new problem. The current episode started in the past 7 days. The problem has been gradually improving. The cough is productive of purulent sputum. Associated symptoms include chills and a sore throat. Pertinent negatives include no chest pain, ear congestion, fever, headaches, nasal congestion, postnasal drip or shortness of breath. Nothing aggravates the symptoms. Treatments tried: ibuprofen. The treatment provided moderate relief.   Past Medical History  Diagnosis Date  . ALLERGIC RHINITIS   . OA (osteoarthritis)     Left thumb, hand and knees  . GERD (gastroesophageal reflux disease)   . HYPERLIPIDEMIA   . OSTEOPOROSIS   . B12 nutritional deficiency dx 07/2010     Review of Systems  Constitutional: Positive for chills. Negative for fever and appetite change.  HENT: Positive for sore throat. Negative for postnasal drip.   Eyes: Negative for discharge and itching.  Respiratory: Positive for cough. Negative for shortness of breath.   Cardiovascular: Negative for chest pain, palpitations and leg swelling.  Neurological: Negative for headaches.       Objective:   Physical Exam  Constitutional: She is oriented to person, place, and time. She appears well-developed and well-nourished. No distress.  HENT:  Head: Normocephalic and atraumatic.  Right Ear: External ear normal.  Left Ear: External ear normal.  Nose: Nose normal.  Mouth/Throat: No oropharyngeal exudate.  Eyes: Conjunctivae and EOM are normal. Pupils are equal, round, and reactive to light. Right eye exhibits no discharge. Left eye exhibits no discharge. No scleral icterus.  Neck: Normal range of motion. Neck supple. No thyromegaly present.  Cardiovascular: Normal rate and normal heart sounds.   Pulmonary/Chest: Effort normal and breath sounds normal. No respiratory distress. She has no wheezes.   Lymphadenopathy:    She has no cervical adenopathy.  Neurological: She is alert and oriented to person, place, and time.  Skin: Skin is warm and dry. No rash noted. She is not diaphoretic.  Psychiatric: She has a normal mood and affect. Her behavior is normal. Judgment and thought content normal.          Assessment & Plan:   Viral URI - continue supportive care with Ibuprofen.  Pt encouraged to hydrate and get adequate rest.  Pt to call back if symptoms worsen/not improved in the next 3 days.    I have personally reviewed this case with NP student. I also personally examined this patient. I agree with history and findings as documented above. I reviewed, discussed and approve of the assessment and plan as listed above. Rene Paci, MD

## 2012-11-20 NOTE — Patient Instructions (Addendum)
It was good to see you today.  We have reviewed your prior records including labs and tests today  B12 shot updated today  If you develop worsening symptoms or fever, call and we can reconsider antibiotics, but it does not appear necessary to use antibiotics at this time.  Continue symptomatic care as discussed and as ongoing  Upper Respiratory Infection, Adult An upper respiratory infection (URI) is also known as the common cold. It is often caused by a type of germ (virus). Colds are easily spread (contagious). You can pass it to others by kissing, coughing, sneezing, or drinking out of the same glass. Usually, you get better in 1 or 2 weeks.  HOME CARE   Only take medicine as told by your doctor.  Use a warm mist humidifier or breathe in steam from a hot shower.  Drink enough water and fluids to keep your pee (urine) clear or pale yellow.  Get plenty of rest.  Return to work when your temperature is back to normal or as told by your doctor. You may use a face mask and wash your hands to stop your cold from spreading. GET HELP RIGHT AWAY IF:   After the first few days, you feel you are getting worse.  You have questions about your medicine.  You have chills, shortness of breath, or brown or red spit (mucus).  You have yellow or brown snot (nasal discharge) or pain in the face, especially when you bend forward.  You have a fever, puffy (swollen) neck, pain when you swallow, or white spots in the back of your throat.  You have a bad headache, ear pain, sinus pain, or chest pain.  You have a high-pitched whistling sound when you breathe in and out (wheezing).  You have a lasting cough or cough up blood.  You have sore muscles or a stiff neck. MAKE SURE YOU:   Understand these instructions.  Will watch your condition.  Will get help right away if you are not doing well or get worse. Document Released: 07/07/2007 Document Revised: 04/12/2011 Document Reviewed:  05/25/2010 [facility] Patient Information 2014 [place], [place].

## 2012-11-20 NOTE — Progress Notes (Signed)
  Subjective:    HPI  complains of sore throat and cold symptoms  Onset >1 week ago, wax/wane symptoms  associated with rhinorrhea, sneezing, sore throat, mild headache and low grade fever Also myalgias, sinus pressure and mild chest congestion No relief with OTC meds Precipitated by sick contacts at home  Past Medical History  Diagnosis Date  . ALLERGIC RHINITIS   . OA (osteoarthritis)     Left thumb, hand and knees  . GERD (gastroesophageal reflux disease)   . HYPERLIPIDEMIA   . OSTEOPOROSIS   . B12 nutritional deficiency dx 07/2010    Review of Systems Constitutional: No fever or night sweats, no unexpected weight change Pulmonary: No pleurisy or hemoptysis Cardiovascular: No chest pain or palpitations     Objective:   Physical Exam BP 120/82  Pulse 63  Temp(Src) 97.5 F (36.4 C) (Oral)  SpO2 94% GEN: mildly ill appearing and audible head/chest congestion HENT: NCAT, mild sinus tenderness bilaterally, nares with clear discharge, oropharynx mild erythema, no exudate Eyes: Vision grossly intact, no conjunctivitis Lungs: Clear to auscultation without rhonchi or wheeze, no increased work of breathing Cardiovascular: Regular rate and rhythm, no bilateral edema  Lab Results  Component Value Date   WBC 8.5 10/24/2009   HGB 13.9 10/24/2009   HCT 40.6 10/24/2009   PLT 242.0 10/24/2009   GLUCOSE 102* 10/24/2009   CHOL 193 07/31/2012   TRIG 360.0* 07/31/2012   HDL 45.00 07/31/2012   LDLDIRECT 96.8 07/31/2012   LDLCALC 78 07/16/2011   ALT 27 07/16/2011   AST 31 07/16/2011   NA 141 10/24/2009   K 3.8 10/24/2009   CL 105 10/24/2009   CREATININE 0.9 10/24/2009   BUN 19 10/24/2009   CO2 27 10/24/2009   TSH 1.61 07/31/2012       Assessment & Plan:  Viral URI  Cough, postnasal drip related to above   Explained lack of efficacy for antibiotics in viral disease recommended OTC cough suppression -  Symptomatic care with Tylenol or Advil, hydration and rest -  salt gargle advised  as needed

## 2012-12-21 ENCOUNTER — Ambulatory Visit (INDEPENDENT_AMBULATORY_CARE_PROVIDER_SITE_OTHER): Payer: Medicare Other

## 2012-12-21 DIAGNOSIS — E538 Deficiency of other specified B group vitamins: Secondary | ICD-10-CM | POA: Diagnosis not present

## 2012-12-21 DIAGNOSIS — Z23 Encounter for immunization: Secondary | ICD-10-CM

## 2012-12-21 MED ORDER — CYANOCOBALAMIN 1000 MCG/ML IJ SOLN
1000.0000 ug | Freq: Once | INTRAMUSCULAR | Status: AC
  Administered 2012-12-21: 1000 ug via INTRAMUSCULAR

## 2013-01-01 ENCOUNTER — Other Ambulatory Visit

## 2013-01-10 ENCOUNTER — Ambulatory Visit (INDEPENDENT_AMBULATORY_CARE_PROVIDER_SITE_OTHER)
Admission: RE | Admit: 2013-01-10 | Discharge: 2013-01-10 | Disposition: A | Discharge status: Home / Self Care | Payer: Medicare Other | Source: Ambulatory Visit | Attending: Internal Medicine | Admitting: Internal Medicine

## 2013-01-10 DIAGNOSIS — M810 Age-related osteoporosis without current pathological fracture: Secondary | ICD-10-CM | POA: Diagnosis not present

## 2013-01-15 ENCOUNTER — Telehealth: Payer: Self-pay | Admitting: Internal Medicine

## 2013-01-15 DIAGNOSIS — M810 Age-related osteoporosis without current pathological fracture: Secondary | ICD-10-CM

## 2013-01-15 NOTE — Telephone Encounter (Signed)
Please call patient. Her bone density shows progressive bone loss since last scan 12/2010. Previous -2.1 in 2010 improved to -1.7 with treatment in 2012, but now on no medications for bones.  In addition to OTC calcium 1200 mg daily and vitamin D 2000 units daily, I recommend prolia injections every 6 months. Please arrange authorization for Prolia. Once authorization received, ok to schedule and begin Prolia injections here  No other changes recommended. Thanks

## 2013-01-16 NOTE — Telephone Encounter (Signed)
Message sent to [facility] to check authorization on Prolia.

## 2013-01-16 NOTE — Telephone Encounter (Signed)
Spoke to pt told her bone density shows progressive bone loss since last scan 12/2010. Previous -2.1 in 2010 improved to -1.7 with treatment in 2012, but now on no medications for bones. In addition she would like you to take OTC calcium 1200 mg daily and vitamin D 2000 units daily. Pt verbalized understanding. Also she is recommending  prolia injections every 6 months will send message to have authorization for Prolia done, Once authorization received, someone will call to schedule  Prolia injections. Pt verbalized understanding.

## 2013-01-18 ENCOUNTER — Telehealth: Payer: Self-pay | Admitting: *Deleted

## 2013-01-18 NOTE — Telephone Encounter (Signed)
Notified pt with ruby response. Transferred to schedulers for nurse visit appt...Raechel Chute

## 2013-01-18 NOTE — Telephone Encounter (Signed)
Message copied by Deatra James on Thu Jan 18, 2013 11:47 AM ------      Message from: Clover Mealy      Created: Thu Jan 18, 2013 11:10 AM      Regarding: RE: Alwyn Ren,      Mrs. Kilgour h/b approved for her Prolia inj.  She has a primary & a supplemental plan, so we'll let them pay before collecting a copay.  Please let Alvy Beal know so she can order the Prolia & let me know the scheduled date.      Thanks,      Mora Bellman            ----- Message -----         From: Jimmye Norman, LPN         Sent: 01/16/2013   9:56 AM           To: Crawford Givens McClinton      Subject: Prolia                                                   Good morning,         Hello I am covering for Valentina Gu can you please check authorization for Prolia injections for pt per Dr. Felicity Coyer. Have a great day.            Thanks,      Corky Mull, LPN                   ------

## 2013-01-23 ENCOUNTER — Ambulatory Visit (INDEPENDENT_AMBULATORY_CARE_PROVIDER_SITE_OTHER): Payer: Medicare Other | Admitting: *Deleted

## 2013-01-23 DIAGNOSIS — E538 Deficiency of other specified B group vitamins: Secondary | ICD-10-CM

## 2013-01-23 DIAGNOSIS — M810 Age-related osteoporosis without current pathological fracture: Secondary | ICD-10-CM

## 2013-01-23 MED ORDER — CYANOCOBALAMIN 1000 MCG/ML IJ SOLN
1000.0000 ug | Freq: Once | INTRAMUSCULAR | Status: AC
  Administered 2013-01-23: 1000 ug via INTRAMUSCULAR

## 2013-01-30 ENCOUNTER — Ambulatory Visit (INDEPENDENT_AMBULATORY_CARE_PROVIDER_SITE_OTHER): Payer: Medicare Other

## 2013-01-30 DIAGNOSIS — M810 Age-related osteoporosis without current pathological fracture: Secondary | ICD-10-CM

## 2013-01-30 MED ORDER — DENOSUMAB 60 MG/ML ~~LOC~~ SOLN
60.0000 mg | Freq: Once | SUBCUTANEOUS | Status: AC
  Administered 2013-01-30: 60 mg via SUBCUTANEOUS

## 2013-02-05 ENCOUNTER — Other Ambulatory Visit (INDEPENDENT_AMBULATORY_CARE_PROVIDER_SITE_OTHER): Payer: Medicare Other

## 2013-02-05 ENCOUNTER — Ambulatory Visit: Admitting: Internal Medicine

## 2013-02-05 ENCOUNTER — Encounter: Payer: Self-pay | Admitting: Internal Medicine

## 2013-02-05 ENCOUNTER — Ambulatory Visit (INDEPENDENT_AMBULATORY_CARE_PROVIDER_SITE_OTHER): Payer: Medicare Other | Admitting: Internal Medicine

## 2013-02-05 VITALS — BP 120/78 | HR 77 | Temp 98.2°F | Ht 61.5 in | Wt 144.4 lb

## 2013-02-05 DIAGNOSIS — Z0000 Encounter for general adult medical examination without abnormal findings: Secondary | ICD-10-CM

## 2013-02-05 DIAGNOSIS — R1310 Dysphagia, unspecified: Secondary | ICD-10-CM

## 2013-02-05 DIAGNOSIS — M810 Age-related osteoporosis without current pathological fracture: Secondary | ICD-10-CM

## 2013-02-05 DIAGNOSIS — E785 Hyperlipidemia, unspecified: Secondary | ICD-10-CM | POA: Diagnosis not present

## 2013-02-05 LAB — BASIC METABOLIC PANEL
BUN: 12 mg/dL (ref 6–23)
CALCIUM: 9.9 mg/dL (ref 8.4–10.5)
CO2: 27 mEq/L (ref 19–32)
Chloride: 102 mEq/L (ref 96–112)
Creatinine, Ser: 0.9 mg/dL (ref 0.4–1.2)
GFR: 63.78 mL/min (ref >60.00)
Glucose, Bld: 85 mg/dL (ref 70–99)
Potassium: 3.9 mEq/L (ref 3.5–5.1)
Sodium: 138 mEq/L (ref 135–145)

## 2013-02-05 LAB — CBC WITH DIFFERENTIAL/PLATELET
Basophils Absolute: 0 10*3/uL (ref 0.0–0.1)
Basophils Relative: 0.2 % (ref 0.0–3.0)
EOS ABS: 0.2 10*3/uL (ref 0.0–0.7)
EOS PCT: 2.3 % (ref 0.0–5.0)
HCT: 44.7 % (ref 36.0–46.0)
Hemoglobin: 15.4 g/dL — ABNORMAL HIGH (ref 12.0–15.0)
Lymphocytes Relative: 32.3 % (ref 12.0–46.0)
Lymphs Abs: 3.1 10*3/uL (ref 0.7–4.0)
MCHC: 34.4 g/dL (ref 30.0–36.0)
MCV: 94.5 fl (ref 78.0–100.0)
MONO ABS: 0.8 10*3/uL (ref 0.1–1.0)
Monocytes Relative: 7.9 % (ref 3.0–12.0)
NEUTROS PCT: 57.3 % (ref 43.0–77.0)
Neutro Abs: 5.5 10*3/uL (ref 1.4–7.7)
PLATELETS: 252 10*3/uL (ref 150.0–400.0)
RBC: 4.73 Mil/uL (ref 3.87–5.11)
RDW: 12.4 % (ref 11.5–14.6)
WBC: 9.6 10*3/uL (ref 4.5–10.5)

## 2013-02-05 LAB — TSH: TSH: 2.31 u[IU]/mL (ref 0.35–5.50)

## 2013-02-05 MED ORDER — POLYETHYLENE GLYCOL 3350 17 GM/SCOOP PO POWD: 17.0000 g | Freq: Every day | ORAL | Status: AC

## 2013-02-05 MED ORDER — OMEPRAZOLE 20 MG PO CPDR: 20.0000 mg | DELAYED_RELEASE_CAPSULE | Freq: Every day | ORAL | Status: DC

## 2013-02-05 NOTE — Assessment & Plan Note (Signed)
Poor tol of Boniva in 2011 -  Then on Actonel, but stopped by gyn fall 2013 due to dysphagia reviewed DEXA 01/2013: due to progression of dz off bisphos, begab Prolia q30mo Note and reviewed Calcium/Vit D causing constipation - encouraged dietary intake as tolerated, natural fiber and OTC Miralax prn

## 2013-02-05 NOTE — Progress Notes (Signed)
Subjective:    Patient ID: Joann Peterson, female    DOB: [birth year], 68 y.o.   MRN: [id number]  HPI   Here for medicare wellness  Diet: heart healthy or DM if diabetic Physical activity: sedentary Depression/mood screen: negative Hearing: intact to whispered voice Visual acuity: grossly normal, performs annual eye exam  ADLs: capable Fall risk: none Home safety: good Cognitive evaluation: intact to orientation, naming, recall and repetition EOL planning: adv directives, full code/ I agree  I have personally reviewed and have noted 1. The patient's medical and social history 2. Their use of alcohol, tobacco or illicit drugs 3. Their current medications and supplements 4. The patient's functional ability including ADL's, fall risks, home safety risks and hearing or visual impairment. 5. Diet and physical activities 6. Evidence for depression or mood disorders  Also reviewed chronic medical issues and current concerns  Past Medical History  Diagnosis Date  . ALLERGIC RHINITIS   . OA (osteoarthritis)     Left thumb, hand and knees  . GERD (gastroesophageal reflux disease)   . HYPERLIPIDEMIA   . OSTEOPOROSIS   . B12 nutritional deficiency dx 07/2010   Family History  Problem Relation Age of Onset  . Arthritis Mother   . Hyperlipidemia Mother   . Hypertension Mother   . Heart disease Mother   . Arthritis Father   . Hyperlipidemia Father   . Lung cancer Father   . Prostate cancer Father   . Colon polyps Father   . Colon cancer Paternal Grandfather   . Colon cancer Paternal Uncle    History  Substance Use Topics  . Smoking status: Former Smoker    Quit date: 11/01/2008  . Smokeless tobacco: Never Used     Comment: Married, lives with spouse and g-son (raising him). Works as Solicitor at [employer]  . Alcohol Use: Yes     Comment: Rarely    Review of Systems  Constitutional: Negative for fatigue and unexpected weight change.  Respiratory: Negative for  cough, shortness of breath and wheezing.   Cardiovascular: Negative for chest pain, palpitations and leg swelling.  Gastrointestinal: Positive for constipation (related to Ca +Vit D). Negative for nausea, abdominal pain and diarrhea.       "sticking sensation" with swallow intermittently - solids and liquids - no regurgitation or vomiting, not associated with pain or weight loss or GERD  Neurological: Negative for dizziness, weakness, light-headedness and headaches.  Psychiatric/Behavioral: Negative for dysphoric mood. The patient is not nervous/anxious.   All other systems reviewed and are negative.       Objective:   Physical Exam BP 120/78  Pulse 77  Temp(Src) 98.2 F (36.8 C) (Oral)  Ht 5' 1.5" (1.562 m)  Wt 144 lb 6.4 oz (65.499 kg)  BMI 26.85 kg/m2  SpO2 93% Wt Readings from Last 3 Encounters:  02/05/13 144 lb 6.4 oz (65.499 kg)  07/31/12 143 lb 6.4 oz (65.046 kg)  01/13/12 139 lb 6.4 oz (63.231 kg)   Constitutional: She is overweight, but appears well-developed and well-nourished. No distress.  HENT: Head: Normocephalic and atraumatic. Ears: B TMs ok, no erythema or effusion; Nose: Nose normal. Mouth/Throat: Oropharynx is clear and moist. No oropharyngeal exudate.  Eyes: Conjunctivae and EOM are normal. Pupils are equal, round, and reactive to light. No scleral icterus.  Neck: Normal range of motion. Neck supple. No JVD present. No thyromegaly present.  Cardiovascular: Normal rate, regular rhythm and normal heart sounds.  No murmur heard. No BLE edema.  Pulmonary/Chest: Effort normal and breath sounds normal. No respiratory distress. She has no wheezes.  Abdominal: Soft. Bowel sounds are normal. She exhibits no distension. There is no tenderness. no masses Musculoskeletal: Normal range of motion, no joint effusions. No gross deformities Neurological: She is alert and oriented to person, place, and time. No cranial nerve deficit. Coordination, balance, strength, speech and gait  are normal.  Skin: Skin is warm and dry. No rash noted. No erythema.  Psychiatric: She has a normal mood and affect. Her behavior is normal. Judgment and thought content normal.   Lab Results  Component Value Date   WBC 8.5 10/24/2009   HGB 13.9 10/24/2009   HCT 40.6 10/24/2009   PLT 242.0 10/24/2009   GLUCOSE 102* 10/24/2009   CHOL 193 07/31/2012   TRIG 360.0* 07/31/2012   HDL 45.00 07/31/2012   LDLDIRECT 96.8 07/31/2012   LDLCALC 78 07/16/2011   ALT 27 07/16/2011   AST 31 07/16/2011   NA 141 10/24/2009   K 3.8 10/24/2009   CL 105 10/24/2009   CREATININE 0.9 10/24/2009   BUN 19 10/24/2009   CO2 27 10/24/2009   TSH 1.61 07/31/2012        Assessment & Plan:   AWV/v70.0 - Today patient counseled on age appropriate routine health concerns for screening and prevention, each reviewed and up to date or declined. Immunizations reviewed and up to date or declined. Labs ordered and reviewed. Risk factors for depression reviewed and negative. Hearing function and visual acuity are intact. ADLs screened and addressed as needed. Functional ability and level of safety reviewed and appropriate. Education, counseling and referrals performed based on assessed risks today. Patient provided with a copy of personalized plan for preventive services.  "sticking sensation" with swallow - intermittent, not associated with regurgitation, heartburn or weight loss. Has no red flags on hx or exam - will treat with PPI once daily for 30 days for possible esophagitis symptoms. Patient agrees to call symptoms unimproved with therapy, sooner if worse. Also check CBC, Bmet and TSH  Constipation. Likely related to medication side effect pregnancy calcium and vitamin D) education reassurance provided. Check labs as noted. Advised over-the-counter MiraLAX once daily. Reviewed normal colonoscopy 2013 and chronic IBS symptoms with bloating after meals  Also See problem list. Medications and labs reviewed today.

## 2013-02-05 NOTE — Progress Notes (Signed)
Pre-visit discussion using our clinic review tool. No additional management support is needed unless otherwise documented below in the visit note.  

## 2013-02-05 NOTE — Assessment & Plan Note (Signed)
On statin - tolerating well Check annually - titrate as needed The current medical regimen is effective;  continue present plan and medications.   

## 2013-02-05 NOTE — Patient Instructions (Addendum)
It was good to see you today.  We have reviewed your prior records including labs and tests today  Test(s) ordered today. Your results will be released to [place] (or called to you) after review, usually within 72hours after test completion. If any changes need to be made, you will be notified at that same time.  Medications reviewed and updated, start omeprazole 20 mg once daily and MiraLAX 1 scoop daily ( both over-the-counter); no other changes recommended at this time.  If swallowing symptoms become worse or if unimproved after 30 days of omeprazole, please let us know for further evaluation and treatment as the  Please schedule followup in 6 months, call sooner if problems.  Health Maintenance, Female A healthy lifestyle and preventative care can promote health and wellness.  Maintain regular health, dental, and eye exams.  Eat a healthy diet. Foods like vegetables, fruits, whole grains, low-fat dairy products, and lean protein foods contain the nutrients you need without too many calories. Decrease your intake of foods high in solid fats, added sugars, and salt. Get information about a proper diet from your caregiver, if necessary.  Regular physical exercise is one of the most important things you can do for your health. Most adults should get at least 150 minutes of moderate-intensity exercise (any activity that increases your heart rate and causes you to sweat) each week. In addition, most adults need muscle-strengthening exercises on 2 or more days a week.   Maintain a healthy weight. The body mass index (BMI) is a screening tool to identify possible weight problems. It provides an estimate of body fat based on height and weight. Your caregiver can help determine your BMI, and can help you achieve or maintain a healthy weight. For adults 20 years and older:  A BMI below 18.5 is considered underweight.  A BMI of 18.5 to 24.9 is normal.  A BMI of 25 to 29.9 is considered  overweight.  A BMI of 30 and above is considered obese.  Maintain normal blood lipids and cholesterol by exercising and minimizing your intake of saturated fat. Eat a balanced diet with plenty of fruits and vegetables. Blood tests for lipids and cholesterol should begin at age 78 and be repeated every 5 years. If your lipid or cholesterol levels are high, you are over 50, or you are a high risk for heart disease, you may need your cholesterol levels checked more frequently.Ongoing high lipid and cholesterol levels should be treated with medicines if diet and exercise are not effective.  If you smoke, find out from your caregiver how to quit. If you do not use tobacco, do not start.  Lung cancer screening is recommended for adults aged 2 80 years who are at high risk for developing lung cancer because of a history of smoking. Yearly low-dose computed tomography (CT) is recommended for people who have at least a 30-pack-year history of smoking and are a current smoker or have quit within the past 15 years. A pack year of smoking is smoking an average of 1 pack of cigarettes a day for 1 year (for example: 1 pack a day for 30 years or 2 packs a day for 15 years). Yearly screening should continue until the smoker has stopped smoking for at least 15 years. Yearly screening should also be stopped for people who develop a health problem that would prevent them from having lung cancer treatment.  If you are pregnant, do not drink alcohol. If you are breastfeeding, be very cautious  about drinking alcohol. If you are not pregnant and choose to drink alcohol, do not exceed 1 drink per day. One drink is considered to be 12 ounces (355 mL) of beer, 5 ounces (148 mL) of wine, or 1.5 ounces (44 mL) of liquor.  Avoid use of street drugs. Do not share needles with anyone. Ask for help if you need support or instructions about stopping the use of drugs.  High blood pressure causes heart disease and increases the risk  of stroke. Blood pressure should be checked at least every 1 to 2 years. Ongoing high blood pressure should be treated with medicines, if weight loss and exercise are not effective.  If you are 38 to 68 years old, ask your caregiver if you should take aspirin to prevent strokes.  Diabetes screening involves taking a blood sample to check your fasting blood sugar level. This should be done once every 3 years, after age 16, if you are within normal weight and without risk factors for diabetes. Testing should be considered at a younger age or be carried out more frequently if you are overweight and have at least 1 risk factor for diabetes.  Breast cancer screening is essential preventative care for women. You should practice "breast self-awareness." This means understanding the normal appearance and feel of your breasts and may include breast self-examination. Any changes detected, no matter how small, should be reported to a caregiver. Women in their 48s and 30s should have a clinical breast exam (CBE) by a caregiver as part of a regular health exam every 1 to 3 years. After age 41, women should have a CBE every year. Starting at age 66, women should consider having a mammogram (breast X-ray) every year. Women who have a family history of breast cancer should talk to their caregiver about genetic screening. Women at a high risk of breast cancer should talk to their caregiver about having an MRI and a mammogram every year.  Breast cancer gene (BRCA)-related cancer risk assessment is recommended for women who have family members with BRCA-related cancers. BRCA-related cancers include breast, ovarian, tubal, and peritoneal cancers. Having family members with these cancers may be associated with an increased risk for harmful changes (mutations) in the breast cancer genes BRCA1 and BRCA2. Results of the assessment will determine the need for genetic counseling and BRCA1 and BRCA2 testing.  The Pap test is a  screening test for cervical cancer. Women should have a Pap test starting at age 15. Between ages 86 and 87, Pap tests should be repeated every 2 years. Beginning at age 35, you should have a Pap test every 3 years as long as the past 3 Pap tests have been normal. If you had a hysterectomy for a problem that was not cancer or a condition that could lead to cancer, then you no longer need Pap tests. If you are between ages 38 and 50, and you have had normal Pap tests going back 10 years, you no longer need Pap tests. If you have had past treatment for cervical cancer or a condition that could lead to cancer, you need Pap tests and screening for cancer for at least 20 years after your treatment. If Pap tests have been discontinued, risk factors (such as a new sexual partner) need to be reassessed to determine if screening should be resumed. Some women have medical problems that increase the chance of getting cervical cancer. In these cases, your caregiver may recommend more frequent screening and Pap tests.  The human papillomavirus (HPV) test is an additional test that may be used for cervical cancer screening. The HPV test looks for the virus that can cause the cell changes on the cervix. The cells collected during the Pap test can be tested for HPV. The HPV test could be used to screen women aged 1 years and older, and should be used in women of any age who have unclear Pap test results. After the age of 2, women should have HPV testing at the same frequency as a Pap test.  Colorectal cancer can be detected and often prevented. Most routine colorectal cancer screening begins at the age of 85 and continues through age 35. However, your caregiver may recommend screening at an earlier age if you have risk factors for colon cancer. On a yearly basis, your caregiver may provide home test kits to check for hidden blood in the stool. Use of a small camera at the end of a tube, to directly examine the colon  (sigmoidoscopy or colonoscopy), can detect the earliest forms of colorectal cancer. Talk to your caregiver about this at age 57, when routine screening begins. Direct examination of the colon should be repeated every 5 to 10 years through age 93, unless early forms of pre-cancerous polyps or small growths are found.  Hepatitis C blood testing is recommended for all people born from [birth year] through 1965 and any individual with known risks for hepatitis C.  Practice safe sex. Use condoms and avoid high-risk sexual practices to reduce the spread of sexually transmitted infections (STIs). Sexually active women aged 1 and younger should be checked for Chlamydia, which is a common sexually transmitted infection. Older women with new or multiple partners should also be tested for Chlamydia. Testing for other STIs is recommended if you are sexually active and at increased risk.  Osteoporosis is a disease in which the bones lose minerals and strength with aging. This can result in serious bone fractures. The risk of osteoporosis can be identified using a bone density scan. Women ages 24 and over and women at risk for fractures or osteoporosis should discuss screening with their caregivers. Ask your caregiver whether you should be taking a calcium supplement or vitamin D to reduce the rate of osteoporosis.  Menopause can be associated with physical symptoms and risks. Hormone replacement therapy is available to decrease symptoms and risks. You should talk to your caregiver about whether hormone replacement therapy is right for you.  Use sunscreen. Apply sunscreen liberally and repeatedly throughout the day. You should seek shade when your shadow is shorter than you. Protect yourself by wearing long sleeves, pants, a wide-brimmed hat, and sunglasses year round, whenever you are outdoors.  Notify your caregiver of new moles or changes in moles, especially if there is a change in shape or color. Also notify your  caregiver if a mole is larger than the size of a pencil eraser.  Stay current with your immunizations. Document Released: 08/03/2010 Document Revised: 05/15/2012 Document Reviewed: 08/03/2010 [facility] Patient Information 2014 [place].

## 2013-02-23 ENCOUNTER — Telehealth: Payer: Self-pay

## 2013-02-23 ENCOUNTER — Ambulatory Visit (INDEPENDENT_AMBULATORY_CARE_PROVIDER_SITE_OTHER): Payer: Medicare Other

## 2013-02-23 DIAGNOSIS — E538 Deficiency of other specified B group vitamins: Secondary | ICD-10-CM | POA: Diagnosis not present

## 2013-02-23 MED ORDER — CYANOCOBALAMIN 1000 MCG/ML IJ SOLN
1000.0000 ug | Freq: Once | INTRAMUSCULAR | Status: AC
  Administered 2013-02-23: 1000 ug via INTRAMUSCULAR

## 2013-02-23 NOTE — Telephone Encounter (Signed)
Patient came in for a nurse visit for B-12 injection. She questions if she needs an MMR booster with all the outbreak going on. Also she asked about a tetanus booster. She states she can get these at her next nurse visit for B-12. Please advise. Thanks

## 2013-02-26 NOTE — Telephone Encounter (Signed)
Ok for MMR and Td next nurse visit 

## 2013-02-26 NOTE — Telephone Encounter (Signed)
Notified pt with md response. Pt states she would rather get MMR first ath her next nurse visit & td at her visit in July...Johny Chess

## 2013-03-27 ENCOUNTER — Ambulatory Visit: Payer: Medicare Other

## 2013-04-03 ENCOUNTER — Ambulatory Visit (INDEPENDENT_AMBULATORY_CARE_PROVIDER_SITE_OTHER): Payer: Medicare Other | Admitting: *Deleted

## 2013-04-03 DIAGNOSIS — E538 Deficiency of other specified B group vitamins: Secondary | ICD-10-CM

## 2013-04-03 MED ORDER — CYANOCOBALAMIN 1000 MCG/ML IJ SOLN
1000.0000 ug | Freq: Once | INTRAMUSCULAR | Status: AC
  Administered 2013-04-03: 1000 ug via INTRAMUSCULAR

## 2013-05-11 ENCOUNTER — Ambulatory Visit (INDEPENDENT_AMBULATORY_CARE_PROVIDER_SITE_OTHER): Payer: Medicare Other | Admitting: *Deleted

## 2013-05-11 DIAGNOSIS — E538 Deficiency of other specified B group vitamins: Secondary | ICD-10-CM

## 2013-05-11 MED ORDER — CYANOCOBALAMIN 1000 MCG/ML IJ SOLN
1000.0000 ug | Freq: Once | INTRAMUSCULAR | Status: AC
  Administered 2013-05-11: 1000 ug via INTRAMUSCULAR

## 2013-06-11 ENCOUNTER — Ambulatory Visit (INDEPENDENT_AMBULATORY_CARE_PROVIDER_SITE_OTHER): Payer: Medicare Other | Admitting: *Deleted

## 2013-06-11 ENCOUNTER — Other Ambulatory Visit: Payer: Self-pay | Admitting: *Deleted

## 2013-06-11 DIAGNOSIS — E538 Deficiency of other specified B group vitamins: Secondary | ICD-10-CM | POA: Diagnosis not present

## 2013-06-11 MED ORDER — CYANOCOBALAMIN 1000 MCG/ML IJ SOLN
1000.0000 ug | Freq: Once | INTRAMUSCULAR | Status: AC
  Administered 2013-06-11: 1000 ug via INTRAMUSCULAR

## 2013-06-11 MED ORDER — SIMVASTATIN 40 MG PO TABS: 40.0000 mg | ORAL_TABLET | Freq: Every day | ORAL | Status: DC

## 2013-06-20 ENCOUNTER — Telehealth: Payer: Self-pay | Admitting: Internal Medicine

## 2013-06-20 NOTE — Telephone Encounter (Signed)
Sent pt's info to [employer] for insurance verification. Will let you know as soon as I have a response from them. Thank you.

## 2013-07-02 NOTE — Telephone Encounter (Signed)
I'm sorry, just want to be sure, does pt need to pay $ 180 up front when she come for prolia injection? Thank you.

## 2013-07-02 NOTE — Telephone Encounter (Signed)
I have rec'd pt's insurance verification for Prolia.  She will be required to pay 20%, which will be approximately $180. Pt has met her MCR annual deductible so that will not apply. I have sent a copy of the verification to be scanned into her chart. If you have any questions, please let me know. Thank you.

## 2013-07-04 NOTE — Telephone Encounter (Signed)
No need for apologies, but here at [facility] we just let the patient know what the insurance estimates their responsibility is.  Then we file the insurance and bill the patient the remaining. Be sure to let the patient know this is not guarantee of payment and is only an estimate. They actual amount insurance pays could be higher or lower. Hope this makes sense, if not let me know. Thank you!

## 2013-07-12 ENCOUNTER — Ambulatory Visit (INDEPENDENT_AMBULATORY_CARE_PROVIDER_SITE_OTHER): Payer: Medicare Other

## 2013-07-12 DIAGNOSIS — E538 Deficiency of other specified B group vitamins: Secondary | ICD-10-CM

## 2013-07-12 MED ORDER — CYANOCOBALAMIN 1000 MCG/ML IJ SOLN
1000.0000 ug | Freq: Once | INTRAMUSCULAR | Status: AC
  Administered 2013-07-12: 1000 ug via INTRAMUSCULAR

## 2013-07-31 ENCOUNTER — Ambulatory Visit (INDEPENDENT_AMBULATORY_CARE_PROVIDER_SITE_OTHER): Payer: Medicare Other | Admitting: *Deleted

## 2013-07-31 DIAGNOSIS — M810 Age-related osteoporosis without current pathological fracture: Secondary | ICD-10-CM | POA: Diagnosis not present

## 2013-07-31 MED ORDER — DENOSUMAB 60 MG/ML ~~LOC~~ SOLN
60.0000 mg | Freq: Once | SUBCUTANEOUS | Status: AC
  Administered 2013-07-31: 60 mg via SUBCUTANEOUS

## 2013-08-06 ENCOUNTER — Ambulatory Visit (INDEPENDENT_AMBULATORY_CARE_PROVIDER_SITE_OTHER): Payer: Medicare Other | Admitting: Internal Medicine

## 2013-08-06 ENCOUNTER — Other Ambulatory Visit (INDEPENDENT_AMBULATORY_CARE_PROVIDER_SITE_OTHER): Payer: Medicare Other

## 2013-08-06 ENCOUNTER — Encounter: Payer: Self-pay | Admitting: Internal Medicine

## 2013-08-06 VITALS — BP 122/84 | HR 95 | Temp 97.5°F | Ht 61.5 in | Wt 156.0 lb

## 2013-08-06 DIAGNOSIS — F418 Other specified anxiety disorders: Secondary | ICD-10-CM

## 2013-08-06 DIAGNOSIS — M810 Age-related osteoporosis without current pathological fracture: Secondary | ICD-10-CM | POA: Diagnosis not present

## 2013-08-06 DIAGNOSIS — Z23 Encounter for immunization: Secondary | ICD-10-CM | POA: Diagnosis not present

## 2013-08-06 DIAGNOSIS — F411 Generalized anxiety disorder: Secondary | ICD-10-CM | POA: Diagnosis not present

## 2013-08-06 DIAGNOSIS — E785 Hyperlipidemia, unspecified: Secondary | ICD-10-CM | POA: Diagnosis not present

## 2013-08-06 DIAGNOSIS — E538 Deficiency of other specified B group vitamins: Secondary | ICD-10-CM | POA: Diagnosis not present

## 2013-08-06 LAB — HEPATIC FUNCTION PANEL
ALT: 49 U/L — ABNORMAL HIGH (ref 0–35)
AST: 49 U/L — ABNORMAL HIGH (ref 0–37)
Albumin: 3.9 g/dL (ref 3.5–5.2)
Alkaline Phosphatase: 76 U/L (ref 39–117)
BILIRUBIN DIRECT: 0.1 mg/dL (ref 0.0–0.3)
Total Bilirubin: 0.7 mg/dL (ref 0.2–1.2)
Total Protein: 7 g/dL (ref 6.0–8.3)

## 2013-08-06 LAB — LIPID PANEL
CHOL/HDL RATIO: 5
CHOLESTEROL: 193 mg/dL (ref 0–200)
HDL: 37.6 mg/dL — AB (ref >39.00)
NonHDL: 155.4
Triglycerides: 469 mg/dL — ABNORMAL HIGH (ref 0.0–149.0)
VLDL: 93.8 mg/dL — AB (ref 0.0–40.0)

## 2013-08-06 MED ORDER — ALPRAZOLAM 0.5 MG PO TABS: 0.5000 mg | ORAL_TABLET | Freq: Two times a day (BID) | ORAL | Status: DC | PRN

## 2013-08-06 MED ORDER — LORATADINE 10 MG PO TABS: 10.0000 mg | ORAL_TABLET | Freq: Every day | ORAL | Status: DC | PRN

## 2013-08-06 NOTE — Assessment & Plan Note (Signed)
Dx 07/2010 - on IM monthly replacement for same Advised ok to take oral as well if desired Lab Results  Component Value Date   VITAMINB12 202* 07/21/2010

## 2013-08-06 NOTE — Patient Instructions (Signed)
It was good to see you today.  We have reviewed your prior records including labs and tests today  Tdap ( tetanus, diphtheria and pertussis) updated today  Test(s) ordered today. Your results will be released to [place] (or called to you) after review, usually within 72hours after test completion. If any changes need to be made, you will be notified at that same time.  Medications reviewed and updated Okay for low-dose Xanax as needed for anxiety symptoms -no other changes recommended at this time. Your prescription(s) have been submitted to your pharmacy. Please take as directed and contact our office if you believe you are having problem(s) with the medication(s).  Please schedule followup in 6 months, call sooner if problems.

## 2013-08-06 NOTE — Progress Notes (Signed)
Subjective:    Patient ID: Joann Peterson, female    DOB: [birth year], 68 y.o.   MRN: [id number]  HPI  Patient is here for follow up  Reviewed chronic medical issues and interval medical events  Past Medical History  Diagnosis Date  . ALLERGIC RHINITIS   . OA (osteoarthritis)     Left thumb, hand and knees  . GERD (gastroesophageal reflux disease)   . HYPERLIPIDEMIA   . OSTEOPOROSIS     DEXA 01/2013: -2.2, on Prolia  . B12 nutritional deficiency dx 07/2010    Review of Systems  Constitutional: Positive for activity change and unexpected weight change (up after stopping yoga and exercise program). Negative for fever and fatigue.  Eyes: Negative for visual disturbance.  Respiratory: Negative for cough and shortness of breath.   Cardiovascular: Negative for chest pain and leg swelling.  Psychiatric/Behavioral: Positive for agitation ("stress" related to spouse's PTSD and care of g-kids during summer). Negative for suicidal ideas, confusion, sleep disturbance and self-injury. The patient is nervous/anxious. The patient is not hyperactive.        Objective:   Physical Exam  BP 122/84  Pulse 95  Temp(Src) 97.5 F (36.4 C) (Oral)  Ht 5' 1.5" (1.562 m)  Wt 156 lb (70.761 kg)  BMI 29.00 kg/m2  SpO2 96% Wt Readings from Last 3 Encounters:  08/06/13 156 lb (70.761 kg)  02/05/13 144 lb 6.4 oz (65.499 kg)  07/31/12 143 lb 6.4 oz (65.046 kg)   Constitutional: She is overweight, but appears well-developed and well-nourished. No distress.  Neck: Normal range of motion. Neck supple. No JVD present. No thyromegaly present.  Cardiovascular: Normal rate, regular rhythm and normal heart sounds.  No murmur heard. No BLE edema. Pulmonary/Chest: Effort normal and breath sounds normal. No respiratory distress. She has no wheezes.  Psychiatric: She has a mildly emotional anxious mood and affect. Her behavior is normal. Judgment and thought content normal.   Lab Results  Component Value  Date   WBC 9.6 02/05/2013   HGB 15.4* 02/05/2013   HCT 44.7 02/05/2013   PLT 252.0 02/05/2013   GLUCOSE 85 02/05/2013   CHOL 193 07/31/2012   TRIG 360.0* 07/31/2012   HDL 45.00 07/31/2012   LDLDIRECT 96.8 07/31/2012   LDLCALC 78 07/16/2011   ALT 27 07/16/2011   AST 31 07/16/2011   NA 138 02/05/2013   K 3.9 02/05/2013   CL 102 02/05/2013   CREATININE 0.9 02/05/2013   BUN 12 02/05/2013   CO2 27 02/05/2013   TSH 2.31 02/05/2013    Dg Bone Density  01/15/2013   Osteopenia with lowest T score -2.2 at femoral neck      Assessment & Plan:   Situational anxiety - related to family stressors, reviewed as above - ok for short term low dose xanax prn - rx done - pt will call if worse or unimproved with tx - support offered  Problem List Items Addressed This Visit   B12 deficiency      Dx 07/2010 - on IM monthly replacement for same Advised ok to take oral as well if desired Lab Results  Component Value Date   VITAMINB12 202* 07/21/2010      HYPERLIPIDEMIA - Primary     On statin - tolerating well Check annually - titrate as needed The current medical regimen is effective;  continue present plan and medications.      Relevant Orders      Lipid panel      Hepatic  function panel   OSTEOPOROSIS     Poor tol of Boniva in 2011 -  Then on Actonel, but stopped by gyn fall 2013 due to dysphagia reviewed DEXA 01/2013: due to progression of dz off bisphos, began Prolia q37mo 02/2013 Note and reviewed Calcium/Vit D causing constipation - encouraged dietary intake as tolerated, natural fiber and OTC Miralax prn    Relevant Orders      Hepatic function panel    Other Visit Diagnoses   Situational anxiety        Relevant Medications       ALPRAZolam Duanne Moron) tablet

## 2013-08-06 NOTE — Assessment & Plan Note (Signed)
Poor tol of Boniva in 2011 -  Then on Actonel, but stopped by gyn fall 2013 due to dysphagia reviewed DEXA 01/2013: due to progression of dz off bisphos, began Prolia q3mo 02/2013 Note and reviewed Calcium/Vit D causing constipation - encouraged dietary intake as tolerated, natural fiber and OTC Miralax prn

## 2013-08-06 NOTE — Assessment & Plan Note (Signed)
On statin - tolerating well Check annually - titrate as needed The current medical regimen is effective;  continue present plan and medications.

## 2013-08-06 NOTE — Progress Notes (Signed)
Pre visit review using our clinic review tool, if applicable. No additional management support is needed unless otherwise documented below in the visit note. 

## 2013-08-13 ENCOUNTER — Ambulatory Visit: Payer: Medicare Other

## 2013-09-19 ENCOUNTER — Ambulatory Visit (INDEPENDENT_AMBULATORY_CARE_PROVIDER_SITE_OTHER): Payer: Medicare Other

## 2013-09-19 DIAGNOSIS — E538 Deficiency of other specified B group vitamins: Secondary | ICD-10-CM

## 2013-09-19 MED ORDER — CYANOCOBALAMIN 1000 MCG/ML IJ SOLN
1000.0000 ug | Freq: Once | INTRAMUSCULAR | Status: AC
  Administered 2013-09-19: 1000 ug via INTRAMUSCULAR

## 2013-09-20 DIAGNOSIS — A6000 Herpesviral infection of urogenital system, unspecified: Secondary | ICD-10-CM | POA: Diagnosis not present

## 2013-09-20 DIAGNOSIS — N952 Postmenopausal atrophic vaginitis: Secondary | ICD-10-CM | POA: Diagnosis not present

## 2013-09-20 DIAGNOSIS — Z780 Asymptomatic menopausal state: Secondary | ICD-10-CM | POA: Diagnosis not present

## 2013-09-20 DIAGNOSIS — Z01419 Encounter for gynecological examination (general) (routine) without abnormal findings: Secondary | ICD-10-CM | POA: Diagnosis not present

## 2013-10-18 ENCOUNTER — Ambulatory Visit (INDEPENDENT_AMBULATORY_CARE_PROVIDER_SITE_OTHER): Payer: Medicare Other

## 2013-10-18 DIAGNOSIS — E538 Deficiency of other specified B group vitamins: Secondary | ICD-10-CM

## 2013-10-18 DIAGNOSIS — Z23 Encounter for immunization: Secondary | ICD-10-CM | POA: Diagnosis not present

## 2013-10-18 MED ORDER — CYANOCOBALAMIN 1000 MCG/ML IJ SOLN
1000.0000 ug | Freq: Once | INTRAMUSCULAR | Status: AC
  Administered 2013-10-18: 1000 ug via INTRAMUSCULAR

## 2013-10-23 ENCOUNTER — Other Ambulatory Visit: Payer: Self-pay

## 2013-10-23 DIAGNOSIS — Z1231 Encounter for screening mammogram for malignant neoplasm of breast: Secondary | ICD-10-CM

## 2013-11-19 ENCOUNTER — Ambulatory Visit (INDEPENDENT_AMBULATORY_CARE_PROVIDER_SITE_OTHER): Payer: Medicare Other

## 2013-11-19 DIAGNOSIS — E538 Deficiency of other specified B group vitamins: Secondary | ICD-10-CM | POA: Diagnosis not present

## 2013-11-19 MED ORDER — CYANOCOBALAMIN 1000 MCG/ML IJ SOLN
1000.0000 ug | Freq: Once | INTRAMUSCULAR | Status: AC
  Administered 2013-11-19: 1000 ug via INTRAMUSCULAR

## 2013-11-20 ENCOUNTER — Ambulatory Visit
Admission: RE | Admit: 2013-11-20 | Discharge: 2013-11-20 | Disposition: A | Discharge status: Home / Self Care | Payer: Medicare Other | Source: Ambulatory Visit

## 2013-11-20 DIAGNOSIS — Z1231 Encounter for screening mammogram for malignant neoplasm of breast: Secondary | ICD-10-CM | POA: Diagnosis not present

## 2013-12-19 ENCOUNTER — Ambulatory Visit (INDEPENDENT_AMBULATORY_CARE_PROVIDER_SITE_OTHER): Payer: Medicare Other | Admitting: *Deleted

## 2013-12-19 ENCOUNTER — Telehealth: Payer: Self-pay | Admitting: Internal Medicine

## 2013-12-19 DIAGNOSIS — E538 Deficiency of other specified B group vitamins: Secondary | ICD-10-CM | POA: Diagnosis not present

## 2013-12-19 MED ORDER — CYANOCOBALAMIN 1000 MCG/ML IJ SOLN
1000.0000 ug | Freq: Once | INTRAMUSCULAR | Status: AC
  Administered 2013-12-19: 1000 ug via INTRAMUSCULAR

## 2013-12-19 NOTE — Telephone Encounter (Signed)
Patient will need prolia injection the end of December.  Can you please verify insurance.  Thanks!

## 2013-12-19 NOTE — Telephone Encounter (Signed)
I have electronically sent pt's info for Prolia insurance verification and will notify you once I have a response. Thank you. °

## 2014-01-01 ENCOUNTER — Telehealth: Payer: Self-pay | Admitting: Internal Medicine

## 2014-01-01 NOTE — Telephone Encounter (Signed)
Left vm for patient to call back to schedule prolia.  We do need to let Joann Peterson know to order once we have scheduled.

## 2014-01-01 NOTE — Telephone Encounter (Signed)
I have rec'd pt's ins verification for Prolia.  Her primary ins., Medicare will cover 80% of the injection, leaving pt w/an estimated responsibility of a 20% co-insurance (approx. $180), then her secondary insurance, [employer] will consider the Medicare Part B deductible (pt has met deductible) and co-insurance up to secondary plan's allowable. This means pt will have an estimated responsibility of $0.  Please make pt aware this is an estimate and we will not know exact amt until both insurances have paid. If you have any questions, please let me know.

## 2014-01-01 NOTE — Telephone Encounter (Signed)
Joann Peterson, pt sce her prolia injection for 12/30.  We need to order it.

## 2014-01-02 ENCOUNTER — Other Ambulatory Visit: Payer: Self-pay | Admitting: Internal Medicine

## 2014-01-17 ENCOUNTER — Ambulatory Visit (INDEPENDENT_AMBULATORY_CARE_PROVIDER_SITE_OTHER): Payer: Medicare Other | Admitting: *Deleted

## 2014-01-17 ENCOUNTER — Ambulatory Visit: Payer: Medicare Other

## 2014-01-17 DIAGNOSIS — E538 Deficiency of other specified B group vitamins: Secondary | ICD-10-CM

## 2014-01-17 MED ORDER — CYANOCOBALAMIN 1000 MCG/ML IJ SOLN
1000.0000 ug | Freq: Once | INTRAMUSCULAR | Status: AC
  Administered 2014-01-17: 1000 ug via INTRAMUSCULAR

## 2014-01-18 ENCOUNTER — Ambulatory Visit: Payer: Medicare Other

## 2014-01-22 ENCOUNTER — Ambulatory Visit (INDEPENDENT_AMBULATORY_CARE_PROVIDER_SITE_OTHER): Payer: Medicare Other | Admitting: Family

## 2014-01-22 ENCOUNTER — Encounter: Payer: Self-pay | Admitting: Family

## 2014-01-22 VITALS — BP 152/88 | HR 116 | Temp 100.2°F | Resp 18 | Ht 61.0 in | Wt 153.0 lb

## 2014-01-22 DIAGNOSIS — J329 Chronic sinusitis, unspecified: Secondary | ICD-10-CM | POA: Insufficient documentation

## 2014-01-22 DIAGNOSIS — J019 Acute sinusitis, unspecified: Secondary | ICD-10-CM

## 2014-01-22 MED ORDER — LEVOFLOXACIN 500 MG PO TABS: 500.0000 mg | ORAL_TABLET | Freq: Every day | ORAL | Status: DC

## 2014-01-22 NOTE — Progress Notes (Signed)
Subjective:    Patient ID: Joann Peterson, female    DOB: 1945-12-15, 68 y.o.   MRN: [id number]  Chief Complaint  Patient presents with  . Cough    chest congestion, productive cough, sore throat, ears feel stopped up, chills, fever x3 days     HPI:  Joann Peterson is a 68 y.o. female who presents today for a cough.   Acute symptoms of chest congestion, productive cough, sore throat, and ears being stopped up have been going on for about 3 days ago. Started off with a runny nose and some chills the first part of the weekend. Denies any shortness of breath, nausea, vomiting or diarrhea. Has used over the counter ibuprofen. Denies any recent antibiotic use.   Allergies  Allergen Reactions  . Penicillins Other (See Comments)    Pt doesn't know reaction- was 68 years old and told her whole life allergic    Current Outpatient Prescriptions on File Prior to Visit  Medication Sig Dispense Refill  . ALPRAZolam (XANAX) 0.5 MG tablet Take 1 tablet (0.5 mg total) by mouth 2 (two) times daily as needed for anxiety. 40 tablet 1  . Calcium Citrate (CITRACAL PO) Take by mouth daily.    . Cholecalciferol (VITAMIN D) 2000 UNITS tablet Take 2,000 Units by mouth daily.      . cyanocobalamin (,VITAMIN B-12,) 1000 MCG/ML injection Inject 1 mL (1,000 mcg total) into the muscle every 30 (thirty) days. 1 mL 3  . denosumab (PROLIA) 60 MG/ML SOLN injection Inject 60 mg into the skin every 6 (six) months. Administer in upper arm, thigh, or abdomen 1 mL 1  . loratadine (CLARITIN) 10 MG tablet TAKE 1 TABLET BY MOUTH ONCE DAILY AS NEEDED FOR ALLERGIES 30 tablet 2  . Multiple Vitamins-Minerals (CENTRUM SILVER) tablet Take 1 tablet by mouth daily.    Marland Kitchen omeprazole (PRILOSEC) 20 MG capsule Take 1 capsule (20 mg total) by mouth daily. 30 capsule 3  . polyethylene glycol powder (GLYCOLAX/MIRALAX) powder Take 17 g by mouth daily. 3350 g 1  . simvastatin (ZOCOR) 40 MG tablet Take 1 tablet (40 mg total) by mouth at  bedtime. 90 tablet 2  . fluticasone (FLONASE) 50 MCG/ACT nasal spray Place 2 sprays into the nose daily. 48 g 3   No current facility-administered medications on file prior to visit.    Review of Systems    See HPI   Objective:    BP 152/88 mmHg  Pulse 116  Temp(Src) 100.2 F (37.9 C) (Oral)  Resp 18  Ht 5\' 1"  (1.549 m)  Wt 153 lb (69.4 kg)  BMI 28.92 kg/m2  SpO2 94% Nursing note and vital signs reviewed.  Physical Exam  Constitutional: She is oriented to person, place, and time. She appears well-developed and well-nourished. No distress.  HENT:  Right Ear: Hearing, tympanic membrane, external ear and ear canal normal.  Left Ear: Hearing, tympanic membrane, external ear and ear canal normal.  Nose: Right sinus exhibits maxillary sinus tenderness. Right sinus exhibits no frontal sinus tenderness. Left sinus exhibits maxillary sinus tenderness. Left sinus exhibits no frontal sinus tenderness.  Mouth/Throat: Uvula is midline, oropharynx is clear and moist and mucous membranes are normal.  Cardiovascular: Normal rate, regular rhythm, normal heart sounds and intact distal pulses.   Pulmonary/Chest: Effort normal and breath sounds normal.  Lymphadenopathy:    She has no cervical adenopathy.  Neurological: She is alert and oriented to person, place, and time.  Skin: Skin is warm and  dry.  Psychiatric: She has a normal mood and affect. Her behavior is normal. Judgment and thought content normal.       Assessment & Plan:

## 2014-01-22 NOTE — Progress Notes (Signed)
Pre visit review using our clinic review tool, if applicable. No additional management support is needed unless otherwise documented below in the visit note. 

## 2014-01-22 NOTE — Patient Instructions (Signed)
Thank you for choosing [facility].  Summary/Instructions:  Your prescription(s) have been submitted to your pharmacy. Please take as directed and contact our office if you believe you are having problem(s) with the medication(s).  If your symptoms worsen or fail to improve, please contact our office for further instruction, or in case of emergency go directly to the emergency room at the closest medical facility.   General Recommendations: Please drink plenty of fluids. Sleep in humidified air if possible. Use saline nasal sprays or the Netti pot as needed.   Medicaitons:  Flonase as needed to assist with congestion. You may use Mucinex to help break up congestion as you feel is needed. Tylenol as needed for fever. Coricidin for congestion.   Sinusitis Sinusitis is redness, soreness, and inflammation of the paranasal sinuses. Paranasal sinuses are air pockets within the bones of your face (beneath the eyes, the middle of the forehead, or above the eyes). In healthy paranasal sinuses, mucus is able to drain out, and air is able to circulate through them by way of your nose. However, when your paranasal sinuses are inflamed, mucus and air can become trapped. This can allow bacteria and other germs to grow and cause infection. Sinusitis can develop quickly and last only a short time (acute) or continue over a long period (chronic). Sinusitis that lasts for more than 12 weeks is considered chronic.  CAUSES  Causes of sinusitis include:  Allergies.  Structural abnormalities, such as displacement of the cartilage that separates your nostrils (deviated septum), which can decrease the air flow through your nose and sinuses and affect sinus drainage.  Functional abnormalities, such as when the small hairs (cilia) that line your sinuses and help remove mucus do not work properly or are not present. SIGNS AND SYMPTOMS  Symptoms of acute and chronic sinusitis are the same. The primary symptoms  are pain and pressure around the affected sinuses. Other symptoms include:  Upper toothache.  Earache.  Headache.  Bad breath.  Decreased sense of smell and taste.  A cough, which worsens when you are lying flat.  Fatigue.  Fever.  Thick drainage from your nose, which often is green and may contain pus (purulent).  Swelling and warmth over the affected sinuses. DIAGNOSIS  Your health care provider will perform a physical exam. During the exam, your health care provider may:  Look in your nose for signs of abnormal growths in your nostrils (nasal polyps).  Tap over the affected sinus to check for signs of infection.  View the inside of your sinuses (endoscopy) using an imaging device that has a light attached (endoscope). If your health care provider suspects that you have chronic sinusitis, one or more of the following tests may be recommended:  Allergy tests.  Nasal culture. A sample of mucus is taken from your nose, sent to a lab, and screened for bacteria.  Nasal cytology. A sample of mucus is taken from your nose and examined by your health care provider to determine if your sinusitis is related to an allergy. TREATMENT  Most cases of acute sinusitis are related to a viral infection and will resolve on their own within 10 days. Sometimes medicines are prescribed to help relieve symptoms (pain medicine, decongestants, nasal steroid sprays, or saline sprays).  However, for sinusitis related to a bacterial infection, your health care provider will prescribe antibiotic medicines. These are medicines that will help kill the bacteria causing the infection.  Rarely, sinusitis is caused by a fungal infection. In  theses cases, your health care provider will prescribe antifungal medicine. For some cases of chronic sinusitis, surgery is needed. Generally, these are cases in which sinusitis recurs more than 3 times per year, despite other treatments. HOME CARE INSTRUCTIONS   Drink  plenty of water. Water helps thin the mucus so your sinuses can drain more easily.  Use a humidifier.  Inhale steam 3 to 4 times a day (for example, sit in the bathroom with the shower running).  Apply a warm, moist washcloth to your face 3 to 4 times a day, or as directed by your health care provider.  Use saline nasal sprays to help moisten and clean your sinuses.  Take medicines only as directed by your health care provider.  If you were prescribed either an antibiotic or antifungal medicine, finish it all even if you start to feel better. SEEK IMMEDIATE MEDICAL CARE IF:  You have increasing pain or severe headaches.  You have nausea, vomiting, or drowsiness.  You have swelling around your face.  You have vision problems.  You have a stiff neck.  You have difficulty breathing. MAKE SURE YOU:   Understand these instructions.  Will watch your condition.  Will get help right away if you are not doing well or get worse. Document Released: 01/18/2005 Document Revised: 06/04/2013 Document Reviewed: 02/02/2011 [facility] Patient Information 2015 [place], [place]. This information is not intended to replace advice given to you by your health care provider. Make sure you discuss any questions you have with your health care provider.

## 2014-01-22 NOTE — Assessment & Plan Note (Signed)
Symptoms and exam consistent with acute sinusitis. Start Levaquin. Continue over the counter medication as needed for symptom relief.  Follow up if symptoms worsen or fails to improve.

## 2014-01-30 ENCOUNTER — Ambulatory Visit (INDEPENDENT_AMBULATORY_CARE_PROVIDER_SITE_OTHER): Payer: Medicare Other | Admitting: *Deleted

## 2014-01-30 DIAGNOSIS — M810 Age-related osteoporosis without current pathological fracture: Secondary | ICD-10-CM | POA: Diagnosis not present

## 2014-01-30 MED ORDER — DENOSUMAB 60 MG/ML ~~LOC~~ SOLN
60.0000 mg | Freq: Once | SUBCUTANEOUS | Status: AC
  Administered 2014-01-30: 60 mg via SUBCUTANEOUS

## 2014-02-15 ENCOUNTER — Ambulatory Visit (INDEPENDENT_AMBULATORY_CARE_PROVIDER_SITE_OTHER): Payer: Medicare Other | Admitting: *Deleted

## 2014-02-15 DIAGNOSIS — E538 Deficiency of other specified B group vitamins: Secondary | ICD-10-CM

## 2014-02-15 MED ORDER — CYANOCOBALAMIN 1000 MCG/ML IJ SOLN
1000.0000 ug | Freq: Once | INTRAMUSCULAR | Status: AC
  Administered 2014-02-15: 1000 ug via INTRAMUSCULAR

## 2014-03-05 ENCOUNTER — Encounter: Payer: Self-pay | Admitting: Internal Medicine

## 2014-03-08 ENCOUNTER — Encounter: Payer: Self-pay | Admitting: Internal Medicine

## 2014-03-18 ENCOUNTER — Ambulatory Visit: Payer: Medicare Other

## 2014-03-19 ENCOUNTER — Ambulatory Visit (INDEPENDENT_AMBULATORY_CARE_PROVIDER_SITE_OTHER): Payer: Medicare Other | Admitting: *Deleted

## 2014-03-19 DIAGNOSIS — E538 Deficiency of other specified B group vitamins: Secondary | ICD-10-CM

## 2014-03-19 MED ORDER — CYANOCOBALAMIN 1000 MCG/ML IJ SOLN
1000.0000 ug | Freq: Once | INTRAMUSCULAR | Status: AC
  Administered 2014-03-19: 1000 ug via INTRAMUSCULAR

## 2014-04-17 ENCOUNTER — Ambulatory Visit (INDEPENDENT_AMBULATORY_CARE_PROVIDER_SITE_OTHER): Payer: Medicare Other | Admitting: *Deleted

## 2014-04-17 DIAGNOSIS — E538 Deficiency of other specified B group vitamins: Secondary | ICD-10-CM | POA: Diagnosis not present

## 2014-04-17 MED ORDER — CYANOCOBALAMIN 1000 MCG/ML IJ SOLN
1000.0000 ug | Freq: Once | INTRAMUSCULAR | Status: AC
  Administered 2014-04-17: 1000 ug via INTRAMUSCULAR

## 2014-04-29 ENCOUNTER — Ambulatory Visit (AMBULATORY_SURGERY_CENTER): Payer: Self-pay | Admitting: *Deleted

## 2014-04-29 VITALS — Ht 61.5 in | Wt 158.0 lb

## 2014-04-29 DIAGNOSIS — Z86010 Personal history of colonic polyps: Secondary | ICD-10-CM

## 2014-04-29 MED ORDER — MOVIPREP 100 G PO SOLR: 1.0000 | Freq: Once | ORAL | Status: DC

## 2014-04-29 NOTE — Progress Notes (Signed)
No home 02 use No diet pills No egg or soy allergy. Had propofol 03-25-11 colon with no issues No issues with past sedation Pt declined emmi video

## 2014-05-07 ENCOUNTER — Other Ambulatory Visit: Payer: Self-pay | Admitting: Internal Medicine

## 2014-05-08 ENCOUNTER — Other Ambulatory Visit: Payer: Self-pay | Admitting: Dermatology

## 2014-05-08 DIAGNOSIS — L853 Xerosis cutis: Secondary | ICD-10-CM | POA: Diagnosis not present

## 2014-05-08 DIAGNOSIS — D485 Neoplasm of uncertain behavior of skin: Secondary | ICD-10-CM | POA: Diagnosis not present

## 2014-05-08 DIAGNOSIS — L821 Other seborrheic keratosis: Secondary | ICD-10-CM | POA: Diagnosis not present

## 2014-05-08 DIAGNOSIS — L570 Actinic keratosis: Secondary | ICD-10-CM | POA: Diagnosis not present

## 2014-05-15 ENCOUNTER — Encounter: Payer: Self-pay | Admitting: Internal Medicine

## 2014-05-15 ENCOUNTER — Ambulatory Visit (AMBULATORY_SURGERY_CENTER): Payer: Medicare Other | Admitting: Internal Medicine

## 2014-05-15 VITALS — BP 116/64 | HR 68 | Temp 97.5°F | Resp 29 | Ht 61.5 in | Wt 158.0 lb

## 2014-05-15 DIAGNOSIS — D120 Benign neoplasm of cecum: Secondary | ICD-10-CM | POA: Diagnosis not present

## 2014-05-15 DIAGNOSIS — Z86010 Personal history of colonic polyps: Secondary | ICD-10-CM

## 2014-05-15 DIAGNOSIS — D122 Benign neoplasm of ascending colon: Secondary | ICD-10-CM | POA: Diagnosis not present

## 2014-05-15 DIAGNOSIS — Z1211 Encounter for screening for malignant neoplasm of colon: Secondary | ICD-10-CM | POA: Diagnosis not present

## 2014-05-15 MED ORDER — SODIUM CHLORIDE 0.9 % IV SOLN: 500.0000 mL | INTRAVENOUS | Status: DC

## 2014-05-15 NOTE — Progress Notes (Signed)
No problems noted in the recovery room. maw 

## 2014-05-15 NOTE — Progress Notes (Signed)
A/ox3 pleased with MAC, report to Annette RN 

## 2014-05-15 NOTE — Op Note (Signed)
[facility]  [address] [place], [address]   COLONOSCOPY PROCEDURE REPORT  PATIENT: Joann Peterson, Joann Peterson  MR#: #[id number] BIRTHDATE: 1945/04/14 , 68  yrs. old GENDER: female ENDOSCOPIST: Jerene Bears, MD PROCEDURE DATE:  05/15/2014 PROCEDURE:   Colonoscopy with snare polypectomy First Screening Colonoscopy - Avg.  risk and is 50 yrs.  old or older - No.  Prior Negative Screening - Now for repeat screening. N/A  History of Adenoma - Now for follow-up colonoscopy & has been > or = to 3 yrs.  Yes hx of adenoma.  Has been 3 or more years since last colonoscopy. ASA CLASS:   Class II INDICATIONS:Surveillance due to prior colonic neoplasia and PH Colon Adenoma. MEDICATIONS: Monitored anesthesia care and Propofol 150 mg IV  DESCRIPTION OF PROCEDURE:   After the risks benefits and alternatives of the procedure were thoroughly explained, informed consent was obtained.  The digital rectal exam revealed no rectal mass.   The LB [id number]  endoscope was introduced through the anus and advanced to the cecum, which was identified by both the appendix and ileocecal valve. No adverse events experienced. The quality of the prep was good.  (MoviPrep was used)  The instrument was then slowly withdrawn as the colon was fully examined.   COLON FINDINGS: Two sessile polyps ranging between 3-10mm in size were found at the cecum and in the ascending colon.  Polypectomies were performed with a cold snare.  The resection was complete, the polyp tissue was completely retrieved and sent to histology. There was moderate diverticulosis noted in the descending colon and sigmoid colon.  Retroflexed views revealed no abnormalities. The time to cecum = 3.0 Withdrawal time = 10.9   The scope was withdrawn and the procedure completed. COMPLICATIONS: There were no immediate complications.  ENDOSCOPIC IMPRESSION: 1.   Two sessile polyps ranging between 3-11mm in size were found at the  cecum and in the ascending colon; polypectomies were performed with a cold snare 2.   Moderate diverticulosis was noted in the descending colon and sigmoid colon  RECOMMENDATIONS: 1.  Await pathology results 2.  High fiber diet 3.  Repeat Colonoscopy in 5 years. 4.  You will receive a letter within 1-2 weeks with the results of your biopsy as well as final recommendations.  Please call my office if you have not received a letter after 3 weeks.  eSigned:  Jerene Bears, MD 05/15/2014 10:10 AM   cc: Rowe Clack, MD and The Patient

## 2014-05-15 NOTE — Progress Notes (Signed)
Called to room to assist during endoscopic procedure.  Patient ID and intended procedure confirmed with present staff. Received instructions for my participation in the procedure from the performing physician.  

## 2014-05-15 NOTE — Patient Instructions (Signed)
YOU HAD AN ENDOSCOPIC PROCEDURE TODAY AT [facility]:   Refer to the procedure report that was given to you for any specific questions about what was found during the examination.  If the procedure report does not answer your questions, please call your gastroenterologist to clarify.  If you requested that your care partner not be given the details of your procedure findings, then the procedure report has been included in a sealed envelope for you to review at your convenience later.  YOU SHOULD EXPECT: Some feelings of bloating in the abdomen. Passage of more gas than usual.  Walking can help get rid of the air that was put into your GI tract during the procedure and reduce the bloating. If you had a lower endoscopy (such as a colonoscopy or flexible sigmoidoscopy) you may notice spotting of blood in your stool or on the toilet paper. If you underwent a bowel prep for your procedure, you may not have a normal bowel movement for a few days.  Please Note:  You might notice some irritation and congestion in your nose or some drainage.  This is from the oxygen used during your procedure.  There is no need for concern and it should clear up in a day or so.  SYMPTOMS TO REPORT IMMEDIATELY:   Following lower endoscopy (colonoscopy or flexible sigmoidoscopy):  Excessive amounts of blood in the stool  Significant tenderness or worsening of abdominal pains  Swelling of the abdomen that is new, acute  Fever of 100F or higher   For urgent or emergent issues, a gastroenterologist can be reached at any hour by calling [phone].   DIET: Your first meal following the procedure should be a small meal and then it is ok to progress to your normal diet. Heavy or fried foods are harder to digest and may make you feel nauseous or bloated.  Likewise, meals heavy in dairy and vegetables can increase bloating.  Drink plenty of fluids but you should avoid alcoholic beverages for 24  hours.  ACTIVITY:  You should plan to take it easy for the rest of today and you should NOT DRIVE or use heavy machinery until tomorrow (because of the sedation medicines used during the test).    FOLLOW UP: Our staff will call the number listed on your records the next business day following your procedure to check on you and address any questions or concerns that you may have regarding the information given to you following your procedure. If we do not reach you, we will leave a message.  However, if you are feeling well and you are not experiencing any problems, there is no need to return our call.  We will assume that you have returned to your regular daily activities without incident.  If any biopsies were taken you will be contacted by phone or by letter within the next 1-3 weeks.  Please call us at [phone] if you have not heard about the biopsies in 3 weeks.    SIGNATURES/CONFIDENTIALITY: You and/or your care partner have signed paperwork which will be entered into your electronic medical record.  These signatures attest to the fact that that the information above on your After Visit Summary has been reviewed and is understood.  Full responsibility of the confidentiality of this discharge information lies with you and/or your care-partner.    Handouts were given to your care partner on polyps, diverticulosis,and a high fiber diet with liberal fluid intake. You might notice some  irritation in your nose or drainage.  This may cause feelings of congestion.  This is from the oxygen, which can be drying.  This is no cause for concern; this should clear up in a few days.  You may resume your current medications today. Await biopsy results. Please call if any questions or concerns.

## 2014-05-16 ENCOUNTER — Telehealth: Payer: Self-pay | Admitting: *Deleted

## 2014-05-16 NOTE — Telephone Encounter (Signed)
  Follow up Call-  Call back number 05/15/2014  Post procedure Call Back phone  # [phone]  Permission to leave phone message Yes     Patient questions:  Do you have a fever, pain , or abdominal swelling? No. Pain Score  0 *  Have you tolerated food without any problems? Yes.    Have you been able to return to your normal activities? Yes.    Do you have any questions about your discharge instructions: Diet   No. Medications  No. Follow up visit  No.  Do you have questions or concerns about your Care? No.  Actions: * If pain score is 4 or above: No action needed, pain <4.

## 2014-05-20 ENCOUNTER — Ambulatory Visit (INDEPENDENT_AMBULATORY_CARE_PROVIDER_SITE_OTHER): Payer: Medicare Other

## 2014-05-20 ENCOUNTER — Encounter: Payer: Self-pay | Admitting: Internal Medicine

## 2014-05-20 DIAGNOSIS — E538 Deficiency of other specified B group vitamins: Secondary | ICD-10-CM

## 2014-05-20 MED ORDER — CYANOCOBALAMIN 1000 MCG/ML IJ SOLN
1000.0000 ug | Freq: Once | INTRAMUSCULAR | Status: AC
  Administered 2014-05-20: 1000 ug via INTRAMUSCULAR

## 2014-06-03 ENCOUNTER — Telehealth: Payer: Self-pay | Admitting: Internal Medicine

## 2014-06-03 NOTE — Telephone Encounter (Signed)
I have electronically sent pt's info for Prolia insurance verification per Malachy Mood Drake's request 02/04/2014 and will notify you once I have a response. Thank you.

## 2014-06-17 NOTE — Telephone Encounter (Signed)
Can we order a prolia for pt.

## 2014-06-17 NOTE — Telephone Encounter (Signed)
Patient called you back and I let her know the information on the encounter below and I went ahead and scheduled her for her Prolia injection.

## 2014-06-17 NOTE — Telephone Encounter (Signed)
I have rec'd pt's insurance verification for Prolia. Pt's primary insurance, Medicare will cover 80% of the prolia injection leaving pt w/a 20% co-insurance (approximately $180), however, her secondary, [employer] will consider the Medicare Part B deductible (pt has already met) and co-insurance up to secondary plan's allowable.  Pt's estimated responsibility is $0.  Please make pt aware this is an estimate and we will not know an exact amt until both of her insurances have paid. I have sent a copy of the summary of benefits to be scanned into her chart. If you have any questions, please let me know. Thank you.

## 2014-06-17 NOTE — Telephone Encounter (Signed)
LVM for pt to call back as soon as possible.   RE: information below.

## 2014-06-18 NOTE — Telephone Encounter (Signed)
Done

## 2014-06-20 ENCOUNTER — Ambulatory Visit: Payer: Medicare Other

## 2014-06-21 ENCOUNTER — Ambulatory Visit (INDEPENDENT_AMBULATORY_CARE_PROVIDER_SITE_OTHER): Payer: Medicare Other

## 2014-06-21 DIAGNOSIS — E538 Deficiency of other specified B group vitamins: Secondary | ICD-10-CM

## 2014-06-21 MED ORDER — CYANOCOBALAMIN 1000 MCG/ML IJ SOLN
1000.0000 ug | Freq: Once | INTRAMUSCULAR | Status: AC
  Administered 2014-06-21: 1000 ug via INTRAMUSCULAR

## 2014-06-21 MED ORDER — SIMVASTATIN 40 MG PO TABS: 40.0000 mg | ORAL_TABLET | Freq: Every day | ORAL | Status: DC

## 2014-07-23 ENCOUNTER — Ambulatory Visit (INDEPENDENT_AMBULATORY_CARE_PROVIDER_SITE_OTHER): Payer: Medicare Other | Admitting: *Deleted

## 2014-07-23 DIAGNOSIS — E538 Deficiency of other specified B group vitamins: Secondary | ICD-10-CM | POA: Diagnosis not present

## 2014-07-23 MED ORDER — CYANOCOBALAMIN 1000 MCG/ML IJ SOLN
1000.0000 ug | Freq: Once | INTRAMUSCULAR | Status: AC
  Administered 2014-07-23: 1000 ug via INTRAMUSCULAR

## 2014-07-25 HISTORY — PX: DENTAL SURGERY: SHX609

## 2014-08-01 ENCOUNTER — Ambulatory Visit: Payer: Medicare Other

## 2014-08-07 ENCOUNTER — Telehealth: Payer: Self-pay

## 2014-08-07 NOTE — Telephone Encounter (Signed)
Spoke to the patient and she agreed to come in at [facility] for AWV;  Asked about being NPO; explained that her lab work is due in July and the doctor will most likely repeat it.  Will confer with Dr. Doug Sou and let her know if she does not have to remain NPO.

## 2014-08-08 NOTE — Telephone Encounter (Signed)
Yes, we would like to get labs done. If possible NPO. If she has later in the day appointment and needs to eat that is okay. She can always come back a different morning to get labs done.

## 2014-08-09 ENCOUNTER — Ambulatory Visit (INDEPENDENT_AMBULATORY_CARE_PROVIDER_SITE_OTHER): Payer: Medicare Other | Admitting: Internal Medicine

## 2014-08-09 ENCOUNTER — Other Ambulatory Visit (INDEPENDENT_AMBULATORY_CARE_PROVIDER_SITE_OTHER): Payer: Medicare Other

## 2014-08-09 ENCOUNTER — Encounter: Payer: Self-pay | Admitting: Internal Medicine

## 2014-08-09 VITALS — BP 132/90 | HR 67 | Temp 97.7°F | Ht 61.5 in | Wt 156.0 lb

## 2014-08-09 DIAGNOSIS — E538 Deficiency of other specified B group vitamins: Secondary | ICD-10-CM

## 2014-08-09 DIAGNOSIS — M810 Age-related osteoporosis without current pathological fracture: Secondary | ICD-10-CM

## 2014-08-09 DIAGNOSIS — R5383 Other fatigue: Secondary | ICD-10-CM | POA: Diagnosis not present

## 2014-08-09 DIAGNOSIS — E785 Hyperlipidemia, unspecified: Secondary | ICD-10-CM | POA: Diagnosis not present

## 2014-08-09 DIAGNOSIS — Z0000 Encounter for general adult medical examination without abnormal findings: Secondary | ICD-10-CM | POA: Diagnosis not present

## 2014-08-09 LAB — TSH: TSH: 1.32 u[IU]/mL (ref 0.35–4.50)

## 2014-08-09 LAB — COMPREHENSIVE METABOLIC PANEL
ALT: 25 U/L (ref 0–35)
AST: 30 U/L (ref 0–37)
Albumin: 3.9 g/dL (ref 3.5–5.2)
Alkaline Phosphatase: 91 U/L (ref 39–117)
BILIRUBIN TOTAL: 0.5 mg/dL (ref 0.2–1.2)
BUN: 17 mg/dL (ref 6–23)
CO2: 27 mEq/L (ref 19–32)
CREATININE: 0.9 mg/dL (ref 0.40–1.20)
Calcium: 9.5 mg/dL (ref 8.4–10.5)
Chloride: 106 mEq/L (ref 96–112)
GFR: 65.94 mL/min (ref >60.00)
Glucose, Bld: 98 mg/dL (ref 70–99)
Potassium: 4.3 mEq/L (ref 3.5–5.1)
Sodium: 142 mEq/L (ref 135–145)
Total Protein: 6.8 g/dL (ref 6.0–8.3)

## 2014-08-09 LAB — LIPID PANEL
Cholesterol: 181 mg/dL (ref 0–200)
HDL: 36.5 mg/dL — ABNORMAL LOW (ref >39.00)
NONHDL: 144.5
Total CHOL/HDL Ratio: 5
Triglycerides: 277 mg/dL — ABNORMAL HIGH (ref 0.0–149.0)
VLDL: 55.4 mg/dL — AB (ref 0.0–40.0)

## 2014-08-09 LAB — VITAMIN B12: Vitamin B-12: 824 pg/mL (ref 211–911)

## 2014-08-09 LAB — LDL CHOLESTEROL, DIRECT: Direct LDL: 92 mg/dL

## 2014-08-09 NOTE — Assessment & Plan Note (Signed)
Checking CMP, TSH, B12 levels. Talked to her about weight loss and increasing exercise to help with her energy levels. Denies any problems with sleeping.

## 2014-08-09 NOTE — Patient Instructions (Addendum)
[name] , Thank you for taking time to come for your Medicare Wellness Visit. I appreciate your ongoing commitment to your health goals. Please review the following plan we discussed and let me know if I can assist you in the future.   Will come in and take her prevnar later   These are the goals we discussed: Goals    . Weight < 136 lb (61.689 kg)     Goal is 136;  Thinking about going to [employer] center; States she has friends who have been successful Referred to [email or web address]        This is a list of the screening recommended for you and due dates:  Health Maintenance  Topic Date Due  . Hepatitis C Screening  03-Nov-1945  . Pneumonia vaccines (2 of 2 - PCV13) 07/31/2013  . Flu Shot  09/02/2014  . Mammogram  11/21/2015  . Colon Cancer Screening  05/15/2019  . Tetanus Vaccine  08/07/2023  . DEXA scan (bone density measurement)  Completed  . Shingles Vaccine  Completed     Health Maintenance Adopting a healthy lifestyle and getting preventive care can go a long way to promote health and wellness. Talk with your health care provider about what schedule of regular examinations is right for you. This is a good chance for you to check in with your provider about disease prevention and staying healthy. In between checkups, there are plenty of things you can do on your own. Experts have done a lot of research about which lifestyle changes and preventive measures are most likely to keep you healthy. Ask your health care provider for more information. WEIGHT AND DIET  Eat a healthy diet  Be sure to include plenty of vegetables, fruits, low-fat dairy products, and lean protein.  Do not eat a lot of foods high in solid fats, added sugars, or salt.  Get regular exercise. This is one of the most important things you can do for your health.  Most adults should exercise for at least 150 minutes each week. The exercise should increase your heart rate and make you sweat  (moderate-intensity exercise).  Most adults should also do strengthening exercises at least twice a week. This is in addition to the moderate-intensity exercise.  Maintain a healthy weight  Body mass index (BMI) is a measurement that can be used to identify possible weight problems. It estimates body fat based on height and weight. Your health care provider can help determine your BMI and help you achieve or maintain a healthy weight.  For females 61 years of age and older:   A BMI below 18.5 is considered underweight.  A BMI of 18.5 to 24.9 is normal.  A BMI of 25 to 29.9 is considered overweight.  A BMI of 30 and above is considered obese.  Watch levels of cholesterol and blood lipids  You should start having your blood tested for lipids and cholesterol at 69 years of age, then have this test every 5 years.  You may need to have your cholesterol levels checked more often if:  Your lipid or cholesterol levels are high.  You are older than 69 years of age.  You are at high risk for heart disease.  CANCER SCREENING   Lung Cancer  Lung cancer screening is recommended for adults 51-49 years old who are at high risk for lung cancer because of a history of smoking.  A yearly low-dose CT scan of the lungs is recommended for people who:  Currently smoke.  Have quit within the past 15 years.  Have at least a 30-pack-year history of smoking. A pack year is smoking an average of one pack of cigarettes a day for 1 year.  Yearly screening should continue until it has been 15 years since you quit.  Yearly screening should stop if you develop a health problem that would prevent you from having lung cancer treatment.  Breast Cancer  Practice breast self-awareness. This means understanding how your breasts normally appear and feel.  It also means doing regular breast self-exams. Let your health care provider know about any changes, no matter how small.  If you are in your 20s  or 30s, you should have a clinical breast exam (CBE) by a health care provider every 1-3 years as part of a regular health exam.  If you are 40 or older, have a CBE every year. Also consider having a breast X-ray (mammogram) every year.  If you have a family history of breast cancer, talk to your health care provider about genetic screening.  If you are at high risk for breast cancer, talk to your health care provider about having an MRI and a mammogram every year.  Breast cancer gene (BRCA) assessment is recommended for women who have family members with BRCA-related cancers. BRCA-related cancers include:  Breast.  Ovarian.  Tubal.  Peritoneal cancers.  Results of the assessment will determine the need for genetic counseling and BRCA1 and BRCA2 testing. Cervical Cancer Routine pelvic examinations to screen for cervical cancer are no longer recommended for nonpregnant women who are considered low risk for cancer of the pelvic organs (ovaries, uterus, and vagina) and who do not have symptoms. A pelvic examination may be necessary if you have symptoms including those associated with pelvic infections. Ask your health care provider if a screening pelvic exam is right for you.   The Pap test is the screening test for cervical cancer for women who are considered at risk.  If you had a hysterectomy for a problem that was not cancer or a condition that could lead to cancer, then you no longer need Pap tests.  If you are older than 65 years, and you have had normal Pap tests for the past 10 years, you no longer need to have Pap tests.  If you have had past treatment for cervical cancer or a condition that could lead to cancer, you need Pap tests and screening for cancer for at least 20 years after your treatment.  If you no longer get a Pap test, assess your risk factors if they change (such as having a new sexual partner). This can affect whether you should start being screened again.  Some  women have medical problems that increase their chance of getting cervical cancer. If this is the case for you, your health care provider may recommend more frequent screening and Pap tests.  The human papillomavirus (HPV) test is another test that may be used for cervical cancer screening. The HPV test looks for the virus that can cause cell changes in the cervix. The cells collected during the Pap test can be tested for HPV.  The HPV test can be used to screen women 30 years of age and older. Getting tested for HPV can extend the interval between normal Pap tests from three to five years.  An HPV test also should be used to screen women of any age who have unclear Pap test results.  After 69 years of age, women   should have HPV testing as often as Pap tests.  Colorectal Cancer  This type of cancer can be detected and often prevented.  Routine colorectal cancer screening usually begins at 69 years of age and continues through 69 years of age.  Your health care provider may recommend screening at an earlier age if you have risk factors for colon cancer.  Your health care provider may also recommend using home test kits to check for hidden blood in the stool.  A small camera at the end of a tube can be used to examine your colon directly (sigmoidoscopy or colonoscopy). This is done to check for the earliest forms of colorectal cancer.  Routine screening usually begins at age 50.  Direct examination of the colon should be repeated every 5-10 years through 69 years of age. However, you may need to be screened more often if early forms of precancerous polyps or small growths are found. Skin Cancer  Check your skin from head to toe regularly.  Tell your health care provider about any new moles or changes in moles, especially if there is a change in a mole's shape or color.  Also tell your health care provider if you have a mole that is larger than the size of a pencil eraser.  Always use  sunscreen. Apply sunscreen liberally and repeatedly throughout the day.  Protect yourself by wearing long sleeves, pants, a wide-brimmed hat, and sunglasses whenever you are outside. HEART DISEASE, DIABETES, AND HIGH BLOOD PRESSURE   Have your blood pressure checked at least every 1-2 years. High blood pressure causes heart disease and increases the risk of stroke.  If you are between 55 years and 79 years old, ask your health care provider if you should take aspirin to prevent strokes.  Have regular diabetes screenings. This involves taking a blood sample to check your fasting blood sugar level.  If you are at a normal weight and have a low risk for diabetes, have this test once every three years after 69 years of age.  If you are overweight and have a high risk for diabetes, consider being tested at a younger age or more often. PREVENTING INFECTION  Hepatitis B  If you have a higher risk for hepatitis B, you should be screened for this virus. You are considered at high risk for hepatitis B if:  You were born in a country where hepatitis B is common. Ask your health care provider which countries are considered high risk.  Your parents were born in a high-risk country, and you have not been immunized against hepatitis B (hepatitis B vaccine).  You have HIV or AIDS.  You use needles to inject street drugs.  You live with someone who has hepatitis B.  You have had sex with someone who has hepatitis B.  You get hemodialysis treatment.  You take certain medicines for conditions, including cancer, organ transplantation, and autoimmune conditions. Hepatitis C  Blood testing is recommended for:  Everyone born from 1945 through 1965.  Anyone with known risk factors for hepatitis C. Sexually transmitted infections (STIs)  You should be screened for sexually transmitted infections (STIs) including gonorrhea and chlamydia if:  You are sexually active and are younger than 69 years of  age.  You are older than 69 years of age and your health care provider tells you that you are at risk for this type of infection.  Your sexual activity has changed since you were last screened and you are at an increased   risk for chlamydia or gonorrhea. Ask your health care provider if you are at risk.  If you do not have HIV, but are at risk, it may be recommended that you take a prescription medicine daily to prevent HIV infection. This is called pre-exposure prophylaxis (PrEP). You are considered at risk if:  You are sexually active and do not regularly use condoms or know the HIV status of your partner(s).  You take drugs by injection.  You are sexually active with a partner who has HIV. Talk with your health care provider about whether you are at high risk of being infected with HIV. If you choose to begin PrEP, you should first be tested for HIV. You should then be tested every 3 months for as long as you are taking PrEP.  PREGNANCY   If you are premenopausal and you may become pregnant, ask your health care provider about preconception counseling.  If you may become pregnant, take 400 to 800 micrograms (mcg) of folic acid every day.  If you want to prevent pregnancy, talk to your health care provider about birth control (contraception). OSTEOPOROSIS AND MENOPAUSE   Osteoporosis is a disease in which the bones lose minerals and strength with aging. This can result in serious bone fractures. Your risk for osteoporosis can be identified using a bone density scan.  If you are 81 years of age or older, or if you are at risk for osteoporosis and fractures, ask your health care provider if you should be screened.  Ask your health care provider whether you should take a calcium or vitamin D supplement to lower your risk for osteoporosis.  Menopause may have certain physical symptoms and risks.  Hormone replacement therapy may reduce some of these symptoms and risks. Talk to your health  care provider about whether hormone replacement therapy is right for you.  HOME CARE INSTRUCTIONS   Schedule regular health, dental, and eye exams.  Stay current with your immunizations.   Do not use any tobacco products including cigarettes, chewing tobacco, or electronic cigarettes.  If you are pregnant, do not drink alcohol.  If you are breastfeeding, limit how much and how often you drink alcohol.  Limit alcohol intake to no more than 1 drink per day for nonpregnant women. One drink equals 12 ounces of beer, 5 ounces of wine, or 1 ounces of hard liquor.  Do not use street drugs.  Do not share needles.  Ask your health care provider for help if you need support or information about quitting drugs.  Tell your health care provider if you often feel depressed.  Tell your health care provider if you have ever been abused or do not feel safe at home. Document Released: 08/03/2010 Document Revised: 06/04/2013 Document Reviewed: 12/20/2012 [facility] Patient Information 2015 [place], [place]. This information is not intended to replace advice given to you by your health care provider. Make sure you discuss any questions you have with your health care provider.  Hearing Loss A hearing loss is sometimes called deafness. Hearing loss may be partial or total. CAUSES Hearing loss may be caused by:  Wax in the ear canal.  Infection of the ear canal.  Infection of the middle ear.  Trauma to the ear or surrounding area.  Fluid in the middle ear.  A hole in the eardrum (perforated eardrum).  Exposure to loud sounds or music.  Problems with the hearing nerve.  Certain medications. Hearing loss without wax, infection, or a history of injury may  mean that the nerve is involved. Hearing loss with severe dizziness, nausea and vomiting or ringing in the ear may suggest a hearing nerve irritation or problems in the middle or inner ear. If hearing loss is untreated, there is a greater  likelihood for residual or permanent hearing loss. DIAGNOSIS A hearing test (audiometry) assesses hearing loss. The audiometry test needs to be performed by a hearing specialist (audiologist). TREATMENT Treatment for recent onset of hearing loss may include:  Ear wax removal.  Medications that kill germs (antibiotics).  Cortisone medications.  Prompt follow up with the appropriate specialist. Return of hearing depends on the cause of your hearing loss, so proper medical follow-up is important. Some hearing loss may not be reversible, and a caregiver should discuss care and treatment options with you. SEEK MEDICAL CARE IF:   You have a severe headache, dizziness, or changes in vision.  You have new or increased weakness.  You develop repeated vomiting or other serious medical problems.  You have a fever. Document Released: 01/18/2005 Document Revised: 04/12/2011 Document Reviewed: 05/15/2009 [facility] Patient Information 2015 [place], [place]. This information is not intended to replace advice given to you by your health care provider. Make sure you discuss any questions you have with your health care provider.

## 2014-08-09 NOTE — Progress Notes (Signed)
Subjective:   Joann Peterson is a 69 y.o. female who presents for Medicare Annual (Subsequent) preventive examination.  Review of Systems:   HRA assessment completed during visit;   Personalized education given regarding risk on the problem list that are currently being medically managed;  Lifestyle changes reviewed that would optimize the patient's health long term. To note: Triglycerides 469 and apt for Dr. Doug Sou to discuss/ will assess lifestyle / currently taking simvastatin; HDL 37 BMI: 29.5  Diet: eats out a lot; coke for breakfast; doesn't eat until 11:30  [place] sandwich and counts out potato chips Dinner; cooks  Would like to lose 20lbs;   Exercise: started exercising x 3 times a week community; toning and balance; did like to dance;  Shag;  Psychosocial: raising 69 yo;  Lives with spouse; has step children;   Screenings overdue Ophthalmology exam; haven't been a awhile; enc to check with insurance to see what oop would be and schedule for a visit;  Immunizations; Prevnar due and educated / will take later this year Hepatitis C:  Tetanus July 2025 Shingles; completed Colonoscopy; due 05/2019 EKG No EKG Dexa scan; completed 01/15/2013; on prolia but skipping this dose Mammogram 11/20/2013  Pap; to be done this year;  Hearing:  Dental:  smoking hx; Discussed USPTF rec LDCT  Current Care Team reviewed and updated   smoking hx; Discussed USPTF rec LDCT      Objective:     Vitals: There were no vitals taken for this visit.  Tobacco History  Smoking status  . Former Smoker  . Quit date: 11/01/2008  Smokeless tobacco  . Never Used    Comment: Married, lives with spouse and g-son (raising him). Works as reseptionist at [employer] given: Not Answered   Past Medical History  Diagnosis Date  . ALLERGIC RHINITIS   . OA (osteoarthritis)     Left thumb, hand and knees  . GERD (gastroesophageal reflux disease)   . HYPERLIPIDEMIA   .  OSTEOPOROSIS     DEXA 01/2013: -2.2, on Prolia  . B12 nutritional deficiency dx 07/2010  . Allergy   . Cancer     skin cancer    Past Surgical History  Procedure Laterality Date  . Nasal sinus surgery    . Colonoscopy  03-25-2011  . Polypectomy  03-25-2011   Family History  Problem Relation Age of Onset  . Arthritis Mother   . Hyperlipidemia Mother   . Hypertension Mother   . Heart disease Mother   . Arthritis Father   . Hyperlipidemia Father   . Lung cancer Father   . Prostate cancer Father   . Colon polyps Father   . Colon cancer Paternal Grandfather   . Colon cancer Paternal Uncle    History  Sexual Activity  . Sexual Activity: Not on file    Outpatient Encounter Prescriptions as of 08/09/2014  Medication Sig  . ALPRAZolam (XANAX) 0.5 MG tablet Take 1 tablet (0.5 mg total) by mouth 2 (two) times daily as needed for anxiety. (Patient not taking: Reported on 04/29/2014)  . BIOTIN PO Take by mouth.  . Calcium Citrate (CITRACAL PO) Take by mouth daily.  . Cholecalciferol (VITAMIN D) 2000 UNITS tablet Take 2,000 Units by mouth daily.    . cyanocobalamin (,VITAMIN B-12,) 1000 MCG/ML injection Inject 1 mL (1,000 mcg total) into the muscle every 30 (thirty) days.  Marland Kitchen denosumab (PROLIA) 60 MG/ML SOLN injection Inject 60 mg into the skin every  6 (six) months. Administer in upper arm, thigh, or abdomen  . fluticasone (FLONASE) 50 MCG/ACT nasal spray Place 2 sprays into the nose daily. (Patient not taking: Reported on 04/29/2014)  . loratadine (CLARITIN) 10 MG tablet Take 1 tablet (10 mg total) by mouth daily as needed for allergies. (Patient not taking: Reported on 05/15/2014)  . Multiple Vitamins-Minerals (CENTRUM SILVER) tablet Take 1 tablet by mouth daily.  Marland Kitchen omeprazole (PRILOSEC) 20 MG capsule Take 1 capsule (20 mg total) by mouth daily.  . polyethylene glycol powder (GLYCOLAX/MIRALAX) powder Take 17 g by mouth daily.  . simvastatin (ZOCOR) 40 MG tablet Take 1 tablet (40 mg total) by  mouth at bedtime.  . vitamin C (ASCORBIC ACID) 500 MG tablet Take 500 mg by mouth daily.   No facility-administered encounter medications on file as of 08/09/2014.    Activities of Daily Living No flowsheet data found.  Patient Care Team: Rowe Clack, MD as PCP - General Selinda Orion, MD as Consulting Physician (Obstetrics and Gynecology) Jerene Bears, MD (Gastroenterology)    Assessment:    Objective:   The goal of the wellness visit is to assist the patient how to close the gaps in care and create a preventative care plan for the patient.  Personalized Education was given regarding:   Pt determined a personalized goal; see patient goals; To lose weight; given information on LDL; triglyceride level  Assessment included: smoking (secondary) educated as appropriate; including LDCT if as ppropriate  Bone density scan as appropriate Calcium and Vit D as appropriate/ Osteoporosis risk reviewed; to skip prolia in June per dentist ; in lieu of having teeth pulled and implants   Taking meds without issues; no barriers identified  Labs were and fup visit noted with MD if labs are due to be re-drawn.  Stress: Recommendations for managing stress if assessed as a factor;   No Risk for hepatitis or high risk social behavior identified via hepatitis screen  Educated on shingles and follow up with insurance company for co-pays or charges applied to Part D benefit. Educated on Vaccines;  Safety issues reviewed;  Cognition assessed by AD8; Score 0   Depression screen negative;   Functional status reviewed; no losses in function x 1 year  Bowel and bladder issues assessed  End of life planning was discussed; aging in home or other; plans to complete HCPOA were discussed if not completed  Family and social hx reviewed  Exercise Activities and Dietary recommendations     Goals    None     Fall Risk Fall Risk  02/05/2013 02/05/2013 01/13/2012  Falls in the past year?  No No No   Depression Screen PHQ 2/9 Scores 02/05/2013 02/05/2013 01/13/2012  PHQ - 2 Score 0 0 0     Cognitive Testing No flowsheet data found.  Immunization History  Administered Date(s) Administered  . Influenza Split 01/12/2011, 10/15/2011  . Influenza, High Dose Seasonal PF 12/21/2012  . Influenza,inj,Quad PF,36+ Mos 10/18/2013  . Pneumococcal Polysaccharide-23 07/31/2012  . Td 02/02/2003  . Tdap 08/06/2013  . Zoster 02/08/2011   Screening Tests Health Maintenance  Topic Date Due  . Hepatitis C Screening  11-02-1945  . PNA vac Low Risk Adult (2 of 2 - PCV13) 07/31/2013  . INFLUENZA VACCINE  09/02/2014  . MAMMOGRAM  11/21/2015  . COLONOSCOPY  05/15/2019  . TETANUS/TDAP  08/07/2023  . DEXA SCAN  Completed  . ZOSTAVAX  Completed      Plan:  Plan  Review of screenings completed and plan for completion individualized: Education and counseling performed based on assessed risks today. Patient provided with a copy of personalized plan for preventive services.    Referrals: no referral  But will get eye exam this year  The patient agrees to take the prevnar later this year    During the course of the visit the patient was educated and counseled about the following appropriate screening and preventive services:   Vaccines to include Pneumoccal, Influenza, Hepatitis B, Td, Zostavax, HCV  Electrocardiogram  Cardiovascular Disease  Colorectal cancer screening  Bone density screening  Diabetes screening  Glaucoma screening  Mammography/PAP  Nutrition counseling   Patient Instructions (the written plan) was given to the patient.   Wynetta Fines, RN  08/09/2014

## 2014-08-09 NOTE — Assessment & Plan Note (Signed)
Taking zocor and checking lipid panel today. Adjust as needed, no side effects from therapy.

## 2014-08-09 NOTE — Progress Notes (Signed)
Pre visit review using our clinic review tool, if applicable. No additional management support is needed unless otherwise documented below in the visit note. 

## 2014-08-09 NOTE — Progress Notes (Signed)
Medical screening examination/treatment/procedure(s) were performed by non-physician practitioner and as supervising physician I was immediately available for consultation/collaboration. I agree with above. Kollar, Ronith Berti A, MD   

## 2014-08-09 NOTE — Progress Notes (Signed)
   Subjective:    Patient ID: Joann Peterson, female    DOB: Mar 22, 1945, 69 y.o.   MRN: [id number]  HPI The patient is a 69 YO female who is coming in for some low energy levels. She does have B12 deficiency and takes B12 shots monthly. She has gained a little bit of weight in the last year and thinks that might have something to do with it. She is helping to raise 69 YO right now which is using some of her energy. Denies cold or heat intolerance. Denies fevers or chills.   Review of Systems  Constitutional: Positive for fatigue. Negative for fever, chills, activity change, appetite change and unexpected weight change.  Respiratory: Negative for cough, chest tightness, shortness of breath and wheezing.   Cardiovascular: Negative for chest pain, palpitations and leg swelling.  Gastrointestinal: Negative for abdominal pain, diarrhea, constipation and abdominal distention.  Musculoskeletal: Negative.   Skin: Negative.   Neurological: Negative.       Objective:   Physical Exam  Constitutional: She is oriented to person, place, and time. She appears well-developed and well-nourished.  HENT:  Head: Normocephalic and atraumatic.  Eyes: EOM are normal.  Neck: Normal range of motion.  Cardiovascular: Normal rate and regular rhythm.   Pulmonary/Chest: Effort normal and breath sounds normal. No respiratory distress. She has no wheezes.  Abdominal: Soft. Bowel sounds are normal. She exhibits no distension. There is no tenderness.  Musculoskeletal: She exhibits no edema.  Neurological: She is alert and oriented to person, place, and time. Coordination normal.  Skin: Skin is warm and dry.  Psychiatric: She has a normal mood and affect.   Filed Vitals:   08/09/14 0758  BP: 132/90  Pulse: 67  Temp: 97.7 F (36.5 C)  Height: 5' 1.5" (1.562 m)  Weight: 156 lb (70.761 kg)  SpO2: 96%      Assessment & Plan:

## 2014-08-09 NOTE — Assessment & Plan Note (Signed)
Check B12 level today as not rechecked since she has been on therapy.

## 2014-08-22 ENCOUNTER — Ambulatory Visit (INDEPENDENT_AMBULATORY_CARE_PROVIDER_SITE_OTHER): Payer: Medicare Other

## 2014-08-22 DIAGNOSIS — E538 Deficiency of other specified B group vitamins: Secondary | ICD-10-CM | POA: Diagnosis not present

## 2014-08-22 MED ORDER — CYANOCOBALAMIN 1000 MCG/ML IJ SOLN
1000.0000 ug | Freq: Once | INTRAMUSCULAR | Status: AC
  Administered 2014-08-22: 1000 ug via SUBCUTANEOUS

## 2014-09-05 ENCOUNTER — Other Ambulatory Visit: Payer: Self-pay | Admitting: Internal Medicine

## 2014-09-23 ENCOUNTER — Ambulatory Visit: Payer: Medicare Other

## 2014-09-24 ENCOUNTER — Ambulatory Visit (INDEPENDENT_AMBULATORY_CARE_PROVIDER_SITE_OTHER): Payer: Medicare Other

## 2014-09-24 DIAGNOSIS — D649 Anemia, unspecified: Secondary | ICD-10-CM | POA: Diagnosis not present

## 2014-09-24 MED ORDER — CYANOCOBALAMIN 1000 MCG/ML IJ SOLN
1000.0000 ug | Freq: Once | INTRAMUSCULAR | Status: AC
  Administered 2014-09-24: 1000 ug via INTRAMUSCULAR

## 2014-10-24 ENCOUNTER — Other Ambulatory Visit: Payer: Self-pay

## 2014-10-24 DIAGNOSIS — Z1231 Encounter for screening mammogram for malignant neoplasm of breast: Secondary | ICD-10-CM

## 2014-10-29 ENCOUNTER — Ambulatory Visit (INDEPENDENT_AMBULATORY_CARE_PROVIDER_SITE_OTHER): Payer: Medicare Other | Admitting: *Deleted

## 2014-10-29 DIAGNOSIS — E538 Deficiency of other specified B group vitamins: Secondary | ICD-10-CM | POA: Diagnosis not present

## 2014-10-29 DIAGNOSIS — Z23 Encounter for immunization: Secondary | ICD-10-CM

## 2014-10-29 MED ORDER — CYANOCOBALAMIN 1000 MCG/ML IJ SOLN
1000.0000 ug | Freq: Once | INTRAMUSCULAR | Status: AC
  Administered 2014-10-29: 1000 ug via INTRAMUSCULAR

## 2014-11-22 ENCOUNTER — Ambulatory Visit
Admission: RE | Admit: 2014-11-22 | Discharge: 2014-11-22 | Disposition: A | Discharge status: Home / Self Care | Payer: Medicare Other | Source: Ambulatory Visit

## 2014-11-22 DIAGNOSIS — Z1231 Encounter for screening mammogram for malignant neoplasm of breast: Secondary | ICD-10-CM

## 2014-11-28 ENCOUNTER — Encounter: Payer: Self-pay | Admitting: Internal Medicine

## 2014-11-28 ENCOUNTER — Ambulatory Visit (INDEPENDENT_AMBULATORY_CARE_PROVIDER_SITE_OTHER): Payer: Medicare Other | Admitting: Internal Medicine

## 2014-11-28 VITALS — BP 130/80 | HR 81 | Temp 98.1°F | Resp 16 | Wt 153.0 lb

## 2014-11-28 DIAGNOSIS — M810 Age-related osteoporosis without current pathological fracture: Secondary | ICD-10-CM | POA: Diagnosis not present

## 2014-11-28 DIAGNOSIS — Z23 Encounter for immunization: Secondary | ICD-10-CM | POA: Diagnosis not present

## 2014-11-28 DIAGNOSIS — E538 Deficiency of other specified B group vitamins: Secondary | ICD-10-CM

## 2014-11-28 MED ORDER — CYANOCOBALAMIN 1000 MCG/ML IJ SOLN
1000.0000 ug | Freq: Once | INTRAMUSCULAR | Status: AC
  Administered 2014-11-28: 1000 ug via INTRAMUSCULAR

## 2014-11-28 NOTE — Progress Notes (Signed)
Pre visit review using our clinic review tool, if applicable. No additional management support is needed unless otherwise documented below in the visit note. 

## 2014-11-28 NOTE — Patient Instructions (Signed)
It is okay to take melatonin 5 mg or 10 mg at night time for sleep.   We will get the bone density and then make recommendations about whether we think you need medicine or not.   Insomnia Insomnia is a sleep disorder that makes it difficult to fall asleep or to stay asleep. Insomnia can cause tiredness (fatigue), low energy, difficulty concentrating, mood swings, and poor performance at work or school.  There are three different ways to classify insomnia:  Difficulty falling asleep.  Difficulty staying asleep.  Waking up too early in the morning. Any type of insomnia can be long-term (chronic) or short-term (acute). Both are common. Short-term insomnia usually lasts for three months or less. Chronic insomnia occurs at least three times a week for longer than three months. CAUSES  Insomnia may be caused by another condition, situation, or substance, such as:  Anxiety.  Certain medicines.  Gastroesophageal reflux disease (GERD) or other gastrointestinal conditions.  Asthma or other breathing conditions.  Restless legs syndrome, sleep apnea, or other sleep disorders.  Chronic pain.  Menopause. This may include hot flashes.  Stroke.  Abuse of alcohol, tobacco, or illegal drugs.  Depression.  Caffeine.   Neurological disorders, such as Alzheimer disease.  An overactive thyroid (hyperthyroidism). The cause of insomnia may not be known. RISK FACTORS Risk factors for insomnia include:  Gender. Women are more commonly affected than men.  Age. Insomnia is more common as you get older.  Stress. This may involve your professional or personal life.  Income. Insomnia is more common in people with lower income.  Lack of exercise.   Irregular work schedule or night shifts.  Traveling between different time zones. SIGNS AND SYMPTOMS If you have insomnia, trouble falling asleep or trouble staying asleep is the main symptom. This may lead to other symptoms, such  as:  Feeling fatigued.  Feeling nervous about going to sleep.  Not feeling rested in the morning.  Having trouble concentrating.  Feeling irritable, anxious, or depressed. TREATMENT  Treatment for insomnia depends on the cause. If your insomnia is caused by an underlying condition, treatment will focus on addressing the condition. Treatment may also include:   Medicines to help you sleep.  Counseling or therapy.  Lifestyle adjustments. HOME CARE INSTRUCTIONS   Take medicines only as directed by your health care provider.  Keep regular sleeping and waking hours. Avoid naps.  Keep a sleep diary to help you and your health care provider figure out what could be causing your insomnia. Include:   When you sleep.  When you wake up during the night.  How well you sleep.   How rested you feel the next day.  Any side effects of medicines you are taking.  What you eat and drink.   Make your bedroom a comfortable place where it is easy to fall asleep:  Put up shades or special blackout curtains to block light from outside.  Use a white noise machine to block noise.  Keep the temperature cool.   Exercise regularly as directed by your health care provider. Avoid exercising right before bedtime.  Use relaxation techniques to manage stress. Ask your health care provider to suggest some techniques that may work well for you. These may include:  Breathing exercises.  Routines to release muscle tension.  Visualizing peaceful scenes.  Cut back on alcohol, caffeinated beverages, and cigarettes, especially close to bedtime. These can disrupt your sleep.  Do not overeat or eat spicy foods right before bedtime.  This can lead to digestive discomfort that can make it hard for you to sleep.  Limit screen use before bedtime. This includes:  Watching TV.  Using your smartphone, tablet, and computer.  Stick to a routine. This can help you fall asleep faster. Try to do a  quiet activity, brush your teeth, and go to bed at the same time each night.  Get out of bed if you are still awake after 15 minutes of trying to sleep. Keep the lights down, but try reading or doing a quiet activity. When you feel sleepy, go back to bed.  Make sure that you drive carefully. Avoid driving if you feel very sleepy.  Keep all follow-up appointments as directed by your health care provider. This is important. SEEK MEDICAL CARE IF:   You are tired throughout the day or have trouble in your daily routine due to sleepiness.  You continue to have sleep problems or your sleep problems get worse. SEEK IMMEDIATE MEDICAL CARE IF:   You have serious thoughts about hurting yourself or someone else.   This information is not intended to replace advice given to you by your health care provider. Make sure you discuss any questions you have with your health care provider.   Document Released: 01/16/2000 Document Revised: 10/09/2014 Document Reviewed: 10/19/2013 Elsevier Interactive Patient Education [employer].

## 2014-11-29 ENCOUNTER — Ambulatory Visit: Payer: Medicare Other

## 2014-11-29 NOTE — Assessment & Plan Note (Signed)
Checking Dexa now. Okay to hold prolia for dental procedure and last injection was 12/15 so okay to get done now. Depending on dexa she may be appropriate for drug holiday since she has had 5 years of treatment.

## 2014-11-29 NOTE — Progress Notes (Signed)
   Subjective:    Patient ID: Joann Peterson, female    DOB: May 25, 1945, 69 y.o.   MRN: [id number]  HPI The patient is coming in for follow up of her osteoporosis. She started on treatment in 2010 and had about a 1 year holiday in that time. She missed last prolia injection due to dental work and needs clearance for dental work. No falls or fractures. Last dexa 2014 with osteopenia and improvement from prior.   Review of Systems  Constitutional: Negative for fever, chills, activity change, appetite change and unexpected weight change.  Respiratory: Negative for cough, chest tightness, shortness of breath and wheezing.   Cardiovascular: Negative for chest pain, palpitations and leg swelling.  Gastrointestinal: Negative for abdominal pain, diarrhea, constipation and abdominal distention.  Musculoskeletal: Negative.   Skin: Negative.   Neurological: Negative.       Objective:   Physical Exam  Constitutional: She is oriented to person, place, and time. She appears well-developed and well-nourished.  HENT:  Head: Normocephalic and atraumatic.  Eyes: EOM are normal.  Neck: Normal range of motion.  Cardiovascular: Normal rate and regular rhythm.   Pulmonary/Chest: Effort normal and breath sounds normal. No respiratory distress. She has no wheezes.  Abdominal: Soft. Bowel sounds are normal. She exhibits no distension. There is no tenderness.  Musculoskeletal: She exhibits no edema.  Neurological: She is alert and oriented to person, place, and time. Coordination normal.  Skin: Skin is warm and dry.  Psychiatric: She has a normal mood and affect.   Filed Vitals:   11/28/14 0939  BP: 130/80  Pulse: 81  Temp: 98.1 F (36.7 C)  TempSrc: Oral  Resp: 16  Weight: 153 lb (69.4 kg)  SpO2: 96%      Assessment & Plan:  Prevnar 13 given at visit.

## 2014-12-03 ENCOUNTER — Telehealth: Payer: Self-pay | Admitting: Internal Medicine

## 2014-12-03 NOTE — Telephone Encounter (Signed)
Patient is at the office and will give them a copy of the letter.

## 2014-12-03 NOTE — Telephone Encounter (Signed)
There was no fax number to contact them on their letter. This letter was received twice. The patient at visit about 1 week ago was given a signed copy of this letter and she should have this. It was re-printed yesterday after the patient called and if she has lost the original copy she was come pick this up to take with her to the oral surgeon.

## 2014-12-03 NOTE — Telephone Encounter (Signed)
Joann Peterson w/Dr. Tamela Oddi Oral Surgeon is looking for the letter she faxed over yesterday to Dr. Sharlet Salina to be filled out.  The patient has an appt today at 3:15pm with them and they need that letter to know how to treat her.

## 2014-12-04 ENCOUNTER — Ambulatory Visit
Admission: RE | Admit: 2014-12-04 | Discharge: 2014-12-04 | Disposition: A | Discharge status: Home / Self Care | Payer: Medicare Other | Source: Ambulatory Visit | Attending: Internal Medicine | Admitting: Internal Medicine

## 2014-12-04 DIAGNOSIS — M810 Age-related osteoporosis without current pathological fracture: Secondary | ICD-10-CM

## 2014-12-07 ENCOUNTER — Other Ambulatory Visit: Payer: Self-pay | Admitting: Internal Medicine

## 2014-12-11 ENCOUNTER — Other Ambulatory Visit: Payer: Self-pay | Admitting: Internal Medicine

## 2014-12-31 ENCOUNTER — Ambulatory Visit (INDEPENDENT_AMBULATORY_CARE_PROVIDER_SITE_OTHER): Payer: Medicare Other

## 2014-12-31 DIAGNOSIS — E538 Deficiency of other specified B group vitamins: Secondary | ICD-10-CM | POA: Diagnosis not present

## 2014-12-31 MED ORDER — CYANOCOBALAMIN 1000 MCG/ML IJ SOLN
1000.0000 ug | Freq: Once | INTRAMUSCULAR | Status: AC
  Administered 2014-12-31: 1000 ug via INTRAMUSCULAR

## 2014-12-31 MED ORDER — SIMVASTATIN 40 MG PO TABS: 40.0000 mg | ORAL_TABLET | Freq: Every day | ORAL | Status: DC

## 2014-12-31 NOTE — Addendum Note (Signed)
Addended by: Aviva Signs M on: 12/31/2014 10:04 AM   Modules accepted: Orders

## 2015-01-13 ENCOUNTER — Ambulatory Visit (INDEPENDENT_AMBULATORY_CARE_PROVIDER_SITE_OTHER)
Admission: RE | Admit: 2015-01-13 | Discharge: 2015-01-13 | Disposition: A | Discharge status: Home / Self Care | Payer: Medicare Other | Source: Ambulatory Visit | Attending: Internal Medicine | Admitting: Internal Medicine

## 2015-01-13 DIAGNOSIS — M810 Age-related osteoporosis without current pathological fracture: Secondary | ICD-10-CM | POA: Diagnosis not present

## 2015-01-30 ENCOUNTER — Ambulatory Visit (INDEPENDENT_AMBULATORY_CARE_PROVIDER_SITE_OTHER): Payer: Medicare Other

## 2015-01-30 DIAGNOSIS — E538 Deficiency of other specified B group vitamins: Secondary | ICD-10-CM

## 2015-01-30 MED ORDER — CYANOCOBALAMIN 1000 MCG/ML IJ SOLN: 1000.0000 ug | Freq: Once | INTRAMUSCULAR | Status: DC

## 2015-01-30 MED ORDER — CYANOCOBALAMIN 1000 MCG/ML IJ SOLN
1000.0000 ug | Freq: Once | INTRAMUSCULAR | Status: AC
  Administered 2015-01-30: 1000 ug via INTRAMUSCULAR

## 2015-02-18 ENCOUNTER — Encounter: Payer: Self-pay | Admitting: Internal Medicine

## 2015-03-03 ENCOUNTER — Ambulatory Visit (INDEPENDENT_AMBULATORY_CARE_PROVIDER_SITE_OTHER): Payer: Medicare Other

## 2015-03-03 DIAGNOSIS — E538 Deficiency of other specified B group vitamins: Secondary | ICD-10-CM | POA: Diagnosis not present

## 2015-03-03 MED ORDER — CYANOCOBALAMIN 1000 MCG/ML IJ SOLN
1000.0000 ug | Freq: Once | INTRAMUSCULAR | Status: AC
  Administered 2015-03-03: 1000 ug via INTRAMUSCULAR

## 2015-04-01 ENCOUNTER — Ambulatory Visit (INDEPENDENT_AMBULATORY_CARE_PROVIDER_SITE_OTHER): Payer: Medicare Other | Admitting: General Practice

## 2015-04-01 DIAGNOSIS — E539 Vitamin B deficiency, unspecified: Secondary | ICD-10-CM | POA: Diagnosis not present

## 2015-04-01 MED ORDER — CYANOCOBALAMIN 1000 MCG/ML IJ SOLN
1000.0000 ug | Freq: Once | INTRAMUSCULAR | Status: AC
  Administered 2015-04-01: 1000 ug via INTRAMUSCULAR

## 2015-04-11 ENCOUNTER — Other Ambulatory Visit: Payer: Self-pay | Admitting: Internal Medicine

## 2015-04-29 ENCOUNTER — Ambulatory Visit (INDEPENDENT_AMBULATORY_CARE_PROVIDER_SITE_OTHER): Payer: Medicare Other

## 2015-04-29 DIAGNOSIS — E538 Deficiency of other specified B group vitamins: Secondary | ICD-10-CM | POA: Diagnosis not present

## 2015-04-29 MED ORDER — CYANOCOBALAMIN 1000 MCG/ML IJ SOLN
1000.0000 ug | Freq: Once | INTRAMUSCULAR | Status: AC
  Administered 2015-04-29: 1000 ug via INTRAMUSCULAR

## 2015-05-30 ENCOUNTER — Ambulatory Visit (INDEPENDENT_AMBULATORY_CARE_PROVIDER_SITE_OTHER): Payer: Medicare Other

## 2015-05-30 ENCOUNTER — Telehealth: Payer: Self-pay

## 2015-05-30 DIAGNOSIS — E538 Deficiency of other specified B group vitamins: Secondary | ICD-10-CM | POA: Diagnosis not present

## 2015-05-30 MED ORDER — CYANOCOBALAMIN 1000 MCG/ML IJ SOLN
1000.0000 ug | Freq: Once | INTRAMUSCULAR | Status: AC
  Administered 2015-05-30: 1000 ug via INTRAMUSCULAR

## 2015-05-30 NOTE — Telephone Encounter (Signed)
Pt came in for B12 inj states she has been off her statin for 2 mo. States he has lost roughly 20 to 25 lbs and would like to know her cholesterol levels. Can you order labs and do you recommend her waiting until her CPE in July? Pt ask for assistant to give her a call

## 2015-06-02 NOTE — Telephone Encounter (Signed)
Please let her know that labs may not be covered if not part of a visit but if she wants to take on that risk we can order cholesterol otherwise she can schedule visit to get that ordered.

## 2015-06-02 NOTE — Telephone Encounter (Signed)
Patient is aware and will wait until physical

## 2015-07-01 ENCOUNTER — Ambulatory Visit: Payer: Medicare Other

## 2015-07-02 ENCOUNTER — Ambulatory Visit (INDEPENDENT_AMBULATORY_CARE_PROVIDER_SITE_OTHER): Payer: Medicare Other | Admitting: Geriatric Medicine

## 2015-07-02 DIAGNOSIS — E538 Deficiency of other specified B group vitamins: Secondary | ICD-10-CM

## 2015-07-02 MED ORDER — CYANOCOBALAMIN 1000 MCG/ML IJ SOLN
1000.0000 ug | Freq: Once | INTRAMUSCULAR | Status: AC
  Administered 2015-07-02: 1000 ug via INTRAMUSCULAR

## 2015-07-07 ENCOUNTER — Other Ambulatory Visit: Payer: Self-pay | Admitting: Internal Medicine

## 2015-08-01 ENCOUNTER — Ambulatory Visit: Payer: Medicare Other

## 2015-08-01 ENCOUNTER — Ambulatory Visit (INDEPENDENT_AMBULATORY_CARE_PROVIDER_SITE_OTHER): Payer: Medicare Other

## 2015-08-01 DIAGNOSIS — E538 Deficiency of other specified B group vitamins: Secondary | ICD-10-CM

## 2015-08-01 MED ORDER — CYANOCOBALAMIN 1000 MCG/ML IJ SOLN
1000.0000 ug | Freq: Once | INTRAMUSCULAR | Status: AC
  Administered 2015-08-01: 1000 ug via INTRAMUSCULAR

## 2015-09-02 ENCOUNTER — Ambulatory Visit (INDEPENDENT_AMBULATORY_CARE_PROVIDER_SITE_OTHER): Payer: Medicare Other | Admitting: *Deleted

## 2015-09-02 DIAGNOSIS — E538 Deficiency of other specified B group vitamins: Secondary | ICD-10-CM

## 2015-09-02 MED ORDER — CYANOCOBALAMIN 1000 MCG/ML IJ SOLN
1000.0000 ug | Freq: Once | INTRAMUSCULAR | Status: AC
  Administered 2015-09-02: 1000 ug via INTRAMUSCULAR

## 2015-09-30 ENCOUNTER — Encounter: Payer: Self-pay | Admitting: Student

## 2015-10-01 ENCOUNTER — Other Ambulatory Visit: Payer: Self-pay | Admitting: Internal Medicine

## 2015-10-03 ENCOUNTER — Ambulatory Visit (INDEPENDENT_AMBULATORY_CARE_PROVIDER_SITE_OTHER): Payer: Medicare Other

## 2015-10-03 ENCOUNTER — Other Ambulatory Visit: Payer: Self-pay

## 2015-10-03 DIAGNOSIS — Z23 Encounter for immunization: Secondary | ICD-10-CM

## 2015-10-03 DIAGNOSIS — E538 Deficiency of other specified B group vitamins: Secondary | ICD-10-CM

## 2015-10-03 MED ORDER — CYANOCOBALAMIN 1000 MCG/ML IJ SOLN
1000.0000 ug | Freq: Once | INTRAMUSCULAR | Status: AC
  Administered 2015-10-03: 1000 ug via INTRAMUSCULAR

## 2015-10-03 NOTE — Progress Notes (Signed)
Medical treatment/procedure(s) were performed by non-physician practitioner and as supervising physician I was immediately available for consultation/collaboration. I agree with above. Elizabeth A Crawford, MD  

## 2015-10-28 ENCOUNTER — Other Ambulatory Visit: Payer: Self-pay | Admitting: Gynecology

## 2015-10-28 DIAGNOSIS — N952 Postmenopausal atrophic vaginitis: Secondary | ICD-10-CM | POA: Diagnosis not present

## 2015-10-28 DIAGNOSIS — Z130 Encounter for screening for diseases of the blood and blood-forming organs and certain disorders involving the immune mechanism: Secondary | ICD-10-CM | POA: Diagnosis not present

## 2015-10-28 DIAGNOSIS — Z01419 Encounter for gynecological examination (general) (routine) without abnormal findings: Secondary | ICD-10-CM | POA: Diagnosis not present

## 2015-10-28 DIAGNOSIS — Z780 Asymptomatic menopausal state: Secondary | ICD-10-CM | POA: Diagnosis not present

## 2015-10-28 DIAGNOSIS — Z1231 Encounter for screening mammogram for malignant neoplasm of breast: Secondary | ICD-10-CM

## 2015-10-28 DIAGNOSIS — Z6825 Body mass index (BMI) 25.0-25.9, adult: Secondary | ICD-10-CM | POA: Diagnosis not present

## 2015-10-28 DIAGNOSIS — Z1389 Encounter for screening for other disorder: Secondary | ICD-10-CM | POA: Diagnosis not present

## 2015-11-04 ENCOUNTER — Ambulatory Visit (INDEPENDENT_AMBULATORY_CARE_PROVIDER_SITE_OTHER): Payer: Medicare Other | Admitting: General Practice

## 2015-11-04 ENCOUNTER — Ambulatory Visit: Payer: Medicare Other

## 2015-11-04 DIAGNOSIS — E538 Deficiency of other specified B group vitamins: Secondary | ICD-10-CM

## 2015-11-04 MED ORDER — CYANOCOBALAMIN 1000 MCG/ML IJ SOLN
1000.0000 ug | Freq: Once | INTRAMUSCULAR | Status: AC
  Administered 2015-11-04: 1000 ug via INTRAMUSCULAR

## 2015-11-04 NOTE — Progress Notes (Signed)
Medical treatment/procedure(s) were performed by non-physician practitioner and as supervising physician I was immediately available for consultation/collaboration. I agree with above. Elizabeth A Crawford, MD  

## 2015-11-17 ENCOUNTER — Telehealth: Payer: Self-pay | Admitting: Internal Medicine

## 2015-11-17 NOTE — Telephone Encounter (Signed)
Patient Name: Joann Peterson DOB: 02-04-1945 Initial Comment Caller states c/o weakness and nasal drainage and congestion. Nurse Assessment Nurse: Ronnald Ramp, RN, Miranda Date/Time (Eastern Time): 11/17/2015 10:40:35 AM Confirm and document reason for call. If symptomatic, describe symptoms. You must click the next button to save text entered. ---Caller states she is having nasal drainage and congestion since Wednesday. She has a fever and sore throat on Wed. Has the patient traveled out of the country within the last 30 days? ---No Does the patient have any new or worsening symptoms? ---Yes Will a triage be completed? ---Yes Related visit to physician within the last 2 weeks? ---No Does the PT have any chronic conditions? (i.e. diabetes, asthma, etc.) ---No Is this a behavioral health or substance abuse call? ---No Guidelines Guideline Title Affirmed Question Affirmed Notes Common Cold Cold with no complications (all triage questions negative) Final Disposition User [place], RN, Miranda Disagree/Comply: Leta Baptist

## 2015-12-02 ENCOUNTER — Ambulatory Visit
Admission: RE | Admit: 2015-12-02 | Discharge: 2015-12-02 | Disposition: A | Discharge status: Home / Self Care | Payer: Medicare Other | Source: Ambulatory Visit | Attending: Gynecology | Admitting: Gynecology

## 2015-12-02 DIAGNOSIS — Z1231 Encounter for screening mammogram for malignant neoplasm of breast: Secondary | ICD-10-CM | POA: Diagnosis not present

## 2015-12-05 ENCOUNTER — Ambulatory Visit: Payer: Medicare Other

## 2015-12-09 ENCOUNTER — Telehealth: Payer: Self-pay

## 2015-12-09 NOTE — Telephone Encounter (Signed)
Routing to dr crawford----we have verified insurance for prolia injections in past---however, i'm not sure why patient is no longer taking prolia---is it ok for me to ask if patient would like to start prolia injections again?---please advise, thanks

## 2015-12-09 NOTE — Telephone Encounter (Signed)
Yes, that's fine with me.

## 2015-12-10 NOTE — Telephone Encounter (Signed)
Insurance will be verified for prolia benefits and patient contacted to see if they want to continue with prolia injections----see tamara if any questions

## 2015-12-18 ENCOUNTER — Telehealth: Payer: Self-pay

## 2015-12-18 NOTE — Telephone Encounter (Signed)
Left message advising patient that insurance has been verified for prolia injections with estimated $250 copay---can call back to schedule nurse visit---or talk with tamara if any questions

## 2015-12-28 ENCOUNTER — Other Ambulatory Visit: Payer: Self-pay | Admitting: Internal Medicine

## 2016-01-09 ENCOUNTER — Ambulatory Visit (INDEPENDENT_AMBULATORY_CARE_PROVIDER_SITE_OTHER): Payer: Medicare Other

## 2016-01-09 DIAGNOSIS — E538 Deficiency of other specified B group vitamins: Secondary | ICD-10-CM

## 2016-01-09 MED ORDER — CYANOCOBALAMIN 1000 MCG/ML IJ SOLN
1000.0000 ug | Freq: Once | INTRAMUSCULAR | Status: AC
  Administered 2016-01-09: 1000 ug via INTRAMUSCULAR

## 2016-01-09 NOTE — Progress Notes (Signed)
Medical treatment/procedure(s) were performed by non-physician practitioner and as supervising physician I was immediately available for consultation/collaboration. I agree with above. Briggette Najarian A Aayla Marrocco, MD  

## 2016-01-13 ENCOUNTER — Telehealth: Payer: Self-pay

## 2016-01-13 NOTE — Telephone Encounter (Signed)
Talked with patient about prolia injection---patient is having procedure with dental sx that her surgeon is asking if patient can hold off on prolia injections until sx is completed---patient will call back in 2018 to have insurance reverified

## 2016-02-10 ENCOUNTER — Other Ambulatory Visit (INDEPENDENT_AMBULATORY_CARE_PROVIDER_SITE_OTHER): Payer: Medicare Other

## 2016-02-10 ENCOUNTER — Ambulatory Visit (INDEPENDENT_AMBULATORY_CARE_PROVIDER_SITE_OTHER): Payer: Medicare Other | Admitting: Internal Medicine

## 2016-02-10 ENCOUNTER — Encounter: Payer: Self-pay | Admitting: Internal Medicine

## 2016-02-10 VITALS — BP 110/70 | HR 67 | Temp 97.8°F | Resp 14 | Ht 61.5 in | Wt 143.8 lb

## 2016-02-10 DIAGNOSIS — E559 Vitamin D deficiency, unspecified: Secondary | ICD-10-CM | POA: Diagnosis not present

## 2016-02-10 DIAGNOSIS — E785 Hyperlipidemia, unspecified: Secondary | ICD-10-CM | POA: Diagnosis not present

## 2016-02-10 DIAGNOSIS — E538 Deficiency of other specified B group vitamins: Secondary | ICD-10-CM

## 2016-02-10 DIAGNOSIS — Z0000 Encounter for general adult medical examination without abnormal findings: Secondary | ICD-10-CM | POA: Diagnosis not present

## 2016-02-10 DIAGNOSIS — M810 Age-related osteoporosis without current pathological fracture: Secondary | ICD-10-CM | POA: Diagnosis not present

## 2016-02-10 LAB — CBC
HCT: 46.8 % — ABNORMAL HIGH (ref 36.0–46.0)
HEMOGLOBIN: 16.1 g/dL — AB (ref 12.0–15.0)
MCHC: 34.4 g/dL (ref 30.0–36.0)
MCV: 95.2 fl (ref 78.0–100.0)
PLATELETS: 253 10*3/uL (ref 150.0–400.0)
RBC: 4.91 Mil/uL (ref 3.87–5.11)
RDW: 13 % (ref 11.5–15.5)
WBC: 6.9 10*3/uL (ref 4.0–10.5)

## 2016-02-10 LAB — COMPREHENSIVE METABOLIC PANEL
ALT: 19 U/L (ref 0–35)
AST: 25 U/L (ref 0–37)
Albumin: 4.4 g/dL (ref 3.5–5.2)
Alkaline Phosphatase: 141 U/L — ABNORMAL HIGH (ref 39–117)
BILIRUBIN TOTAL: 0.8 mg/dL (ref 0.2–1.2)
BUN: 13 mg/dL (ref 6–23)
CALCIUM: 9.7 mg/dL (ref 8.4–10.5)
CHLORIDE: 106 meq/L (ref 96–112)
CO2: 26 meq/L (ref 19–32)
CREATININE: 0.92 mg/dL (ref 0.40–1.20)
GFR: 64.01 mL/min (ref >60.00)
GLUCOSE: 91 mg/dL (ref 70–99)
Potassium: 4.1 mEq/L (ref 3.5–5.1)
Sodium: 141 mEq/L (ref 135–145)
Total Protein: 7.4 g/dL (ref 6.0–8.3)

## 2016-02-10 LAB — VITAMIN D 25 HYDROXY (VIT D DEFICIENCY, FRACTURES): VITD: 22.84 ng/mL — ABNORMAL LOW (ref 30.00–100.00)

## 2016-02-10 LAB — LDL CHOLESTEROL, DIRECT: LDL DIRECT: 176 mg/dL

## 2016-02-10 LAB — LIPID PANEL
CHOL/HDL RATIO: 6
Cholesterol: 302 mg/dL — ABNORMAL HIGH (ref 0–200)
HDL: 53.9 mg/dL (ref >39.00)
NONHDL: 247.79
Triglycerides: 289 mg/dL — ABNORMAL HIGH (ref 0.0–149.0)
VLDL: 57.8 mg/dL — AB (ref 0.0–40.0)

## 2016-02-10 LAB — TSH: TSH: 1.45 u[IU]/mL (ref 0.35–4.50)

## 2016-02-10 MED ORDER — CYANOCOBALAMIN 1000 MCG/ML IJ SOLN
1000.0000 ug | Freq: Once | INTRAMUSCULAR | Status: AC
  Administered 2016-02-10: 1000 ug via INTRAMUSCULAR

## 2016-02-10 NOTE — Patient Instructions (Signed)
We are checking the labs today and will call you back with the results.   Keep up the good work with the health and the exercise.    Health Maintenance, Female Introduction Adopting a healthy lifestyle and getting preventive care can go a long way to promote health and wellness. Talk with your health care provider about what schedule of regular examinations is right for you. This is a good chance for you to check in with your provider about disease prevention and staying healthy. In between checkups, there are plenty of things you can do on your own. Experts have done a lot of research about which lifestyle changes and preventive measures are most likely to keep you healthy. Ask your health care provider for more information. Weight and diet Eat a healthy diet  Be sure to include plenty of vegetables, fruits, low-fat dairy products, and lean protein.  Do not eat a lot of foods high in solid fats, added sugars, or salt.  Get regular exercise. This is one of the most important things you can do for your health.  Most adults should exercise for at least 150 minutes each week. The exercise should increase your heart rate and make you sweat (moderate-intensity exercise).  Most adults should also do strengthening exercises at least twice a week. This is in addition to the moderate-intensity exercise. Maintain a healthy weight  Body mass index (BMI) is a measurement that can be used to identify possible weight problems. It estimates body fat based on height and weight. Your health care provider can help determine your BMI and help you achieve or maintain a healthy weight.  For females 68 years of age and older:  A BMI below 18.5 is considered underweight.  A BMI of 18.5 to 24.9 is normal.  A BMI of 25 to 29.9 is considered overweight.  A BMI of 30 and above is considered obese. Watch levels of cholesterol and blood lipids  You should start having your blood tested for lipids and  cholesterol at 71 years of age, then have this test every 5 years.  You may need to have your cholesterol levels checked more often if:  Your lipid or cholesterol levels are high.  You are older than 71 years of age.  You are at high risk for heart disease. Cancer screening Lung Cancer  Lung cancer screening is recommended for adults 56-66 years old who are at high risk for lung cancer because of a history of smoking.  A yearly low-dose CT scan of the lungs is recommended for people who:  Currently smoke.  Have quit within the past 15 years.  Have at least a 30-pack-year history of smoking. A pack year is smoking an average of one pack of cigarettes a day for 1 year.  Yearly screening should continue until it has been 15 years since you quit.  Yearly screening should stop if you develop a health problem that would prevent you from having lung cancer treatment. Breast Cancer  Practice breast self-awareness. This means understanding how your breasts normally appear and feel.  It also means doing regular breast self-exams. Let your health care provider know about any changes, no matter how small.  If you are in your 20s or 30s, you should have a clinical breast exam (CBE) by a health care provider every 1-3 years as part of a regular health exam.  If you are 65 or older, have a CBE every year. Also consider having a breast X-ray (mammogram) every  year.  If you have a family history of breast cancer, talk to your health care provider about genetic screening.  If you are at high risk for breast cancer, talk to your health care provider about having an MRI and a mammogram every year.  Breast cancer gene (BRCA) assessment is recommended for women who have family members with BRCA-related cancers. BRCA-related cancers include:  Breast.  Ovarian.  Tubal.  Peritoneal cancers.  Results of the assessment will determine the need for genetic counseling and BRCA1 and BRCA2  testing. Cervical Cancer  Your health care provider may recommend that you be screened regularly for cancer of the pelvic organs (ovaries, uterus, and vagina). This screening involves a pelvic examination, including checking for microscopic changes to the surface of your cervix (Pap test). You may be encouraged to have this screening done every 3 years, beginning at age 82.  For women ages 33-65, health care providers may recommend pelvic exams and Pap testing every 3 years, or they may recommend the Pap and pelvic exam, combined with testing for human papilloma virus (HPV), every 5 years. Some types of HPV increase your risk of cervical cancer. Testing for HPV may also be done on women of any age with unclear Pap test results.  Other health care providers may not recommend any screening for nonpregnant women who are considered low risk for pelvic cancer and who do not have symptoms. Ask your health care provider if a screening pelvic exam is right for you.  If you have had past treatment for cervical cancer or a condition that could lead to cancer, you need Pap tests and screening for cancer for at least 20 years after your treatment. If Pap tests have been discontinued, your risk factors (such as having a new sexual partner) need to be reassessed to determine if screening should resume. Some women have medical problems that increase the chance of getting cervical cancer. In these cases, your health care provider may recommend more frequent screening and Pap tests. Colorectal Cancer  This type of cancer can be detected and often prevented.  Routine colorectal cancer screening usually begins at 71 years of age and continues through 71 years of age.  Your health care provider may recommend screening at an earlier age if you have risk factors for colon cancer.  Your health care provider may also recommend using home test kits to check for hidden blood in the stool.  A small camera at the end of a  tube can be used to examine your colon directly (sigmoidoscopy or colonoscopy). This is done to check for the earliest forms of colorectal cancer.  Routine screening usually begins at age 69.  Direct examination of the colon should be repeated every 5-10 years through 71 years of age. However, you may need to be screened more often if early forms of precancerous polyps or small growths are found. Skin Cancer  Check your skin from head to toe regularly.  Tell your health care provider about any new moles or changes in moles, especially if there is a change in a mole's shape or color.  Also tell your health care provider if you have a mole that is larger than the size of a pencil eraser.  Always use sunscreen. Apply sunscreen liberally and repeatedly throughout the day.  Protect yourself by wearing long sleeves, pants, a wide-brimmed hat, and sunglasses whenever you are outside. Heart disease, diabetes, and high blood pressure  High blood pressure causes heart disease and increases  the risk of stroke. High blood pressure is more likely to develop in:  People who have blood pressure in the high end of the normal range (130-139/85-89 mm Hg).  People who are overweight or obese.  People who are African American.  If you are 75-35 years of age, have your blood pressure checked every 3-5 years. If you are 80 years of age or older, have your blood pressure checked every year. You should have your blood pressure measured twice-once when you are at a hospital or clinic, and once when you are not at a hospital or clinic. Record the average of the two measurements. To check your blood pressure when you are not at a hospital or clinic, you can use:  An automated blood pressure machine at a pharmacy.  A home blood pressure monitor.  If you are between 41 years and 32 years old, ask your health care provider if you should take aspirin to prevent strokes.  Have regular diabetes screenings. This  involves taking a blood sample to check your fasting blood sugar level.  If you are at a normal weight and have a low risk for diabetes, have this test once every three years after 71 years of age.  If you are overweight and have a high risk for diabetes, consider being tested at a younger age or more often. Preventing infection Hepatitis B  If you have a higher risk for hepatitis B, you should be screened for this virus. You are considered at high risk for hepatitis B if:  You were born in a country where hepatitis B is common. Ask your health care provider which countries are considered high risk.  Your parents were born in a high-risk country, and you have not been immunized against hepatitis B (hepatitis B vaccine).  You have HIV or AIDS.  You use needles to inject street drugs.  You live with someone who has hepatitis B.  You have had sex with someone who has hepatitis B.  You get hemodialysis treatment.  You take certain medicines for conditions, including cancer, organ transplantation, and autoimmune conditions. Hepatitis C  Blood testing is recommended for:  Everyone born from [birth year] through 1965.  Anyone with known risk factors for hepatitis C. Sexually transmitted infections (STIs)  You should be screened for sexually transmitted infections (STIs) including gonorrhea and chlamydia if:  You are sexually active and are younger than 71 years of age.  You are older than 70 years of age and your health care provider tells you that you are at risk for this type of infection.  Your sexual activity has changed since you were last screened and you are at an increased risk for chlamydia or gonorrhea. Ask your health care provider if you are at risk.  If you do not have HIV, but are at risk, it may be recommended that you take a prescription medicine daily to prevent HIV infection. This is called pre-exposure prophylaxis (PrEP). You are considered at risk if:  You are  sexually active and do not regularly use condoms or know the HIV status of your partner(s).  You take drugs by injection.  You are sexually active with a partner who has HIV. Talk with your health care provider about whether you are at high risk of being infected with HIV. If you choose to begin PrEP, you should first be tested for HIV. You should then be tested every 3 months for as long as you are taking PrEP. Pregnancy  If  you are premenopausal and you may become pregnant, ask your health care provider about preconception counseling.  If you may become pregnant, take 400 to 800 micrograms (mcg) of folic acid every day.  If you want to prevent pregnancy, talk to your health care provider about birth control (contraception). Osteoporosis and menopause  Osteoporosis is a disease in which the bones lose minerals and strength with aging. This can result in serious bone fractures. Your risk for osteoporosis can be identified using a bone density scan.  If you are 65 years of age or older, or if you are at risk for osteoporosis and fractures, ask your health care provider if you should be screened.  Ask your health care provider whether you should take a calcium or vitamin D supplement to lower your risk for osteoporosis.  Menopause may have certain physical symptoms and risks.  Hormone replacement therapy may reduce some of these symptoms and risks. Talk to your health care provider about whether hormone replacement therapy is right for you. Follow these instructions at home:  Schedule regular health, dental, and eye exams.  Stay current with your immunizations.  Do not use any tobacco products including cigarettes, chewing tobacco, or electronic cigarettes.  If you are pregnant, do not drink alcohol.  If you are breastfeeding, limit how much and how often you drink alcohol.  Limit alcohol intake to no more than 1 drink per day for nonpregnant women. One drink equals 12 ounces of  beer, 5 ounces of wine, or 1 ounces of hard liquor.  Do not use street drugs.  Do not share needles.  Ask your health care provider for help if you need support or information about quitting drugs.  Tell your health care provider if you often feel depressed.  Tell your health care provider if you have ever been abused or do not feel safe at home. This information is not intended to replace advice given to you by your health care provider. Make sure you discuss any questions you have with your health care provider. Document Released: 08/03/2010 Document Revised: 06/26/2015 Document Reviewed: 10/22/2014  2017 [employer]vier

## 2016-02-10 NOTE — Assessment & Plan Note (Signed)
Had been on prolia and had regained some bone growth and now osteopenia on last DEXA which was discussed with her at visit. She does not wish to resume prolia at this time so we will continue to monitor and dexa due 01/2017 again to monitor for bone loss. No fragility fractures or falls.

## 2016-02-10 NOTE — Assessment & Plan Note (Signed)
Colonoscopy, mammogram, dexa up to date. Flu shot done, pneumonia series completed, shingles completed. Tetanus up to date. She is not exercising and we talked about resuming this. Sun safety and mole surveillance counseling given during visit. 10 year screening recommendations given at visit.

## 2016-02-10 NOTE — Assessment & Plan Note (Signed)
She is off her simvastatin 40 mg daily and likely her LDL will be elevated. She will consider going back on medication if needed. Goal <130.

## 2016-02-10 NOTE — Progress Notes (Signed)
Pre visit review using our clinic review tool, if applicable. No additional management support is needed unless otherwise documented below in the visit note. 

## 2016-02-10 NOTE — Assessment & Plan Note (Signed)
Given injection today and true documentation in the past of low. She wishes to continue with monthly injections to maintain her levels.

## 2016-02-10 NOTE — Progress Notes (Signed)
   Subjective:    Patient ID: Joann Peterson, female    DOB: 08-14-1945, 71 y.o.   MRN: [id number]  HPI Here for medicare wellness, no new complaints. Please see A/P for status and treatment of chronic medical problems.   HPI #2: Coming in for follow up of medical problems including her cholesterol (stopped taking her statin about 1 year ago, has changed diet and lost some weight, feels fine, no chest pains, SOB, numbness or weakness) and her B12 deficiency (getting monthly injections, notices a difference if she misses or is late with shots, no neuropathy or weakness or numbness), and her osteoporosis (most recent DEXA consistent with osteopenia and she stopped doing prolia also about 1 year or more ago, denies falls or fractures, getting some dental work done soon and they don't want her to start back before then, she is not sure she wants to continue since her bone density is stable off it).   Diet: heart healthy Physical activity: sedentary Depression/mood screen: negative Hearing: intact to whispered voice, mild loss Visual acuity: grossly normal, performs annual eye exam  ADLs: capable Fall risk: none Home safety: good Cognitive evaluation: intact to orientation, naming, recall and repetition EOL planning: adv directives discussed  I have personally reviewed and have noted 1. The patient's medical and social history - reviewed today no changes 2. Their use of alcohol, tobacco or illicit drugs 3. Their current medications and supplements 4. The patient's functional ability including ADL's, fall risks, home safety risks and hearing or visual impairment. 5. Diet and physical activities 6. Evidence for depression or mood disorders 7. Care team reviewed and updated (available in snapshot)  Review of Systems  Constitutional: Negative.   HENT: Negative.   Eyes: Negative.   Respiratory: Negative for cough, chest tightness and shortness of breath.   Cardiovascular: Negative for chest  pain, palpitations and leg swelling.  Gastrointestinal: Negative for abdominal distention, abdominal pain, constipation, diarrhea, nausea and vomiting.  Musculoskeletal: Negative.   Skin: Negative.   Neurological: Negative.   Psychiatric/Behavioral: Negative.       Objective:   Physical Exam  Constitutional: She is oriented to person, place, and time. She appears well-developed and well-nourished.  HENT:  Head: Normocephalic and atraumatic.  Eyes: EOM are normal.  Neck: Normal range of motion.  Cardiovascular: Normal rate and regular rhythm.   Pulmonary/Chest: Effort normal and breath sounds normal. No respiratory distress. She has no wheezes. She has no rales.  Abdominal: Soft. Bowel sounds are normal. She exhibits no distension. There is no tenderness. There is no rebound.  Musculoskeletal: She exhibits no edema.  Neurological: She is alert and oriented to person, place, and time. Coordination normal.  Skin: Skin is warm and dry.  Psychiatric: She has a normal mood and affect.   Vitals:   02/10/16 0852  BP: 110/70  Pulse: 67  Resp: 14  Temp: 97.8 F (36.6 C)  TempSrc: Oral  SpO2: 99%  Weight: 143 lb 12 oz (65.2 kg)  Height: 5' 1.5" (1.562 m)      Assessment & Plan:  B12 shot given at visit.

## 2016-02-11 ENCOUNTER — Telehealth: Payer: Self-pay

## 2016-02-11 MED ORDER — SIMVASTATIN 40 MG PO TABS: 40.0000 mg | ORAL_TABLET | Freq: Every day | ORAL | 3 refills | Status: DC

## 2016-02-11 NOTE — Telephone Encounter (Signed)
Simvastatin sent in for refill

## 2016-03-02 ENCOUNTER — Encounter: Payer: Self-pay | Admitting: Internal Medicine

## 2016-03-02 ENCOUNTER — Telehealth: Payer: Self-pay | Admitting: Internal Medicine

## 2016-03-02 ENCOUNTER — Ambulatory Visit (INDEPENDENT_AMBULATORY_CARE_PROVIDER_SITE_OTHER): Payer: Medicare Other | Admitting: Internal Medicine

## 2016-03-02 DIAGNOSIS — B9789 Other viral agents as the cause of diseases classified elsewhere: Secondary | ICD-10-CM | POA: Diagnosis not present

## 2016-03-02 DIAGNOSIS — J069 Acute upper respiratory infection, unspecified: Secondary | ICD-10-CM | POA: Insufficient documentation

## 2016-03-02 NOTE — Telephone Encounter (Signed)
PLEASE NOTE: All timestamps contained within this report are represented as [place] Standard Time. CONFIDENTIALTY NOTICE: This fax transmission is intended only for the addressee. It contains information that is legally privileged, confidential or otherwise protected from use or disclosure. If you are not the intended recipient, you are strictly prohibited from reviewing, disclosing, copying using or disseminating any of this information or taking any action in reliance on or regarding this information. If you have received this fax in error, please notify us immediately by telephone so that we can arrange for its return to [place]. Phone: [phone], Toll-Free: [phone], Fax: [phone] Page: 1 of 1 Call Id: [id number] [facility] Patient Name: Joann Peterson DOB: 01/01/1946 Initial Comment Caller says that since Saturday she has had a low grade fever of 99.8 (oral), vomited on Saturday, and body aches, but not flu like. Nurse Assessment Nurse: Dimas Chyle, RN, Dellis Filbert Date/Time Eilene Ghazi Time): 03/02/2016 9:21:21 AM Confirm and document reason for call. If symptomatic, describe symptoms. ---Caller says that since Saturday she has had a low grade fever of 99.8 (oral), vomited on Saturday, and body aches, but not flu like. Having head congestion. Does the patient have any new or worsening symptoms? ---Yes Will a triage be completed? ---Yes Related visit to physician within the last 2 weeks? ---No Does the PT have any chronic conditions? (i.e. diabetes, asthma, etc.) ---No Is this a behavioral health or substance abuse call? ---No Guidelines Guideline Title Affirmed Question Affirmed Notes Sinus Pain or Congestion Fever present > 3 days (72 hours) Final Disposition User See Physician within [facility], RN, Dellis Filbert Comments Appointment was scheduled for patient and information copied  into EPIC Referrals REFERRED TO PCP OFFICE Disagree/Comply: Comply

## 2016-03-02 NOTE — Progress Notes (Signed)
   Subjective:    Patient ID: Joann Peterson, female    DOB: 1945/09/30, 71 y.o.   MRN: [id number]  HPI The patient is a 71 YO female coming in for cold symptoms for about 3 days. Started this weekend. She has been taking alka seltzer cold medicine which is helping to dry up her sinuses some. She is not taking claritin right now. She denies fevers but is having up to 27F at home. No chills. Some nose congestion and drainage. No to minimal cough. No SOB or chest pain. Some sinus pressure. Clear drainage from her nose.   Review of Systems  Constitutional: Positive for activity change and fatigue. Negative for appetite change, chills, fever and unexpected weight change.  HENT: Positive for congestion, postnasal drip and sinus pressure. Negative for ear discharge, ear pain, rhinorrhea, sinus pain, sore throat and trouble swallowing.   Eyes: Negative.   Respiratory: Positive for cough. Negative for chest tightness, shortness of breath and wheezing.        Minimal if any cough  Cardiovascular: Negative.   Gastrointestinal: Negative.   Musculoskeletal: Negative.       Objective:   Physical Exam  Constitutional: She is oriented to person, place, and time. She appears well-developed and well-nourished.  HENT:  Head: Normocephalic and atraumatic.  Right Ear: External ear normal.  Left Ear: External ear normal.  Oropharynx with redness and clear drainage, no nasal crusting, no sinus pressure.   Eyes: EOM are normal.  Neck: Normal range of motion. No JVD present.  Cardiovascular: Normal rate and regular rhythm.   Pulmonary/Chest: Effort normal and breath sounds normal. No respiratory distress. She has no wheezes. She has no rales.  Abdominal: Soft.  Lymphadenopathy:    She has no cervical adenopathy.  Neurological: She is alert and oriented to person, place, and time.  Skin: Skin is warm and dry.   Vitals:   03/02/16 0957  BP: 110/60  Pulse: 89  Resp: 16  Temp: 97.9 F (36.6 C)    TempSrc: Oral  SpO2: 98%  Weight: 149 lb (67.6 kg)  Height: 5' 1.5" (1.562 m)      Assessment & Plan:

## 2016-03-02 NOTE — Patient Instructions (Signed)
Upper Respiratory Infection, Adult Most upper respiratory infections (URIs) are a viral infection of the air passages leading to the lungs. A URI affects the nose, throat, and upper air passages. The most common type of URI is nasopharyngitis and is typically referred to as "the common cold." URIs run their course and usually go away on their own. Most of the time, a URI does not require medical attention, but sometimes a bacterial infection in the upper airways can follow a viral infection. This is called a secondary infection. Sinus and middle ear infections are common types of secondary upper respiratory infections. Bacterial pneumonia can also complicate a URI. A URI can worsen asthma and chronic obstructive pulmonary disease (COPD). Sometimes, these complications can require emergency medical care and may be life threatening. What are the causes? Almost all URIs are caused by viruses. A virus is a type of germ and can spread from one person to another. What increases the risk? You may be at risk for a URI if:  You smoke.  You have chronic heart or lung disease.  You have a weakened defense (immune) system.  You are very young or very old.  You have nasal allergies or asthma.  You work in crowded or poorly ventilated areas.  You work in health care facilities or schools.  What are the signs or symptoms? Symptoms typically develop 2-3 days after you come in contact with a cold virus. Most viral URIs last 7-10 days. However, viral URIs from the influenza virus (flu virus) can last 14-18 days and are typically more severe. Symptoms may include:  Runny or stuffy (congested) nose.  Sneezing.  Cough.  Sore throat.  Headache.  Fatigue.  Fever.  Loss of appetite.  Pain in your forehead, behind your eyes, and over your cheekbones (sinus pain).  Muscle aches.  How is this diagnosed? Your health care provider may diagnose a URI by:  Physical exam.  Tests to check that your  symptoms are not due to another condition such as: ? Strep throat. ? Sinusitis. ? Pneumonia. ? Asthma.  How is this treated? A URI goes away on its own with time. It cannot be cured with medicines, but medicines may be prescribed or recommended to relieve symptoms. Medicines may help:  Reduce your fever.  Reduce your cough.  Relieve nasal congestion.  Follow these instructions at home:  Take medicines only as directed by your health care provider.  Gargle warm saltwater or take cough drops to comfort your throat as directed by your health care provider.  Use a warm mist humidifier or inhale steam from a shower to increase air moisture. This may make it easier to breathe.  Drink enough fluid to keep your urine clear or pale yellow.  Eat soups and other clear broths and maintain good nutrition.  Rest as needed.  Return to work when your temperature has returned to normal or as your health care provider advises. You may need to stay home longer to avoid infecting others. You can also use a face mask and careful hand washing to prevent spread of the virus.  Increase the usage of your inhaler if you have asthma.  Do not use any tobacco products, including cigarettes, chewing tobacco, or electronic cigarettes. If you need help quitting, ask your health care provider. How is this prevented? The best way to protect yourself from getting a cold is to practice good hygiene.  Avoid oral or hand contact with people with cold symptoms.  Wash your   hands often if contact occurs.  There is no clear evidence that vitamin C, vitamin E, echinacea, or exercise reduces the chance of developing a cold. However, it is always recommended to get plenty of rest, exercise, and practice good nutrition. Contact a health care provider if:  You are getting worse rather than better.  Your symptoms are not controlled by medicine.  You have chills.  You have worsening shortness of breath.  You have  brown or red mucus.  You have yellow or brown nasal discharge.  You have pain in your face, especially when you bend forward.  You have a fever.  You have swollen neck glands.  You have pain while swallowing.  You have white areas in the back of your throat. Get help right away if:  You have severe or persistent: ? Headache. ? Ear pain. ? Sinus pain. ? Chest pain.  You have chronic lung disease and any of the following: ? Wheezing. ? Prolonged cough. ? Coughing up blood. ? A change in your usual mucus.  You have a stiff neck.  You have changes in your: ? Vision. ? Hearing. ? Thinking. ? Mood. This information is not intended to replace advice given to you by your health care provider. Make sure you discuss any questions you have with your health care provider. Document Released: 07/14/2000 Document Revised: 09/21/2015 Document Reviewed: 04/25/2013 Elsevier Interactive Patient Education  2017 Elsevier Inc.  

## 2016-03-02 NOTE — Assessment & Plan Note (Signed)
She will start taking claritin again and talked to her about symptom management. No indication for antibiotics or steroids today. Talked to her about expected course and length.

## 2016-03-02 NOTE — Telephone Encounter (Signed)
Noted  

## 2016-03-02 NOTE — Progress Notes (Signed)
Pre visit review using our clinic review tool, if applicable. No additional management support is needed unless otherwise documented below in the visit note. 

## 2016-03-11 ENCOUNTER — Ambulatory Visit (INDEPENDENT_AMBULATORY_CARE_PROVIDER_SITE_OTHER): Payer: Medicare Other | Admitting: General Practice

## 2016-03-11 DIAGNOSIS — E538 Deficiency of other specified B group vitamins: Secondary | ICD-10-CM

## 2016-03-11 MED ORDER — CYANOCOBALAMIN 1000 MCG/ML IJ SOLN
1000.0000 ug | Freq: Once | INTRAMUSCULAR | Status: AC
  Administered 2016-03-11: 1000 ug via INTRAMUSCULAR

## 2016-03-11 NOTE — Progress Notes (Signed)
Medical treatment/procedure(s) were performed by non-physician practitioner and as supervising physician I was immediately available for consultation/collaboration. I agree with above. Chayden Garrelts A Denyce Harr, MD  

## 2016-04-07 ENCOUNTER — Ambulatory Visit (INDEPENDENT_AMBULATORY_CARE_PROVIDER_SITE_OTHER): Payer: Medicare Other

## 2016-04-07 ENCOUNTER — Telehealth: Payer: Self-pay

## 2016-04-07 DIAGNOSIS — E538 Deficiency of other specified B group vitamins: Secondary | ICD-10-CM

## 2016-04-07 MED ORDER — CYANOCOBALAMIN 1000 MCG/ML IJ SOLN
1000.0000 ug | Freq: Once | INTRAMUSCULAR | Status: AC
  Administered 2016-04-07: 1000 ug via INTRAMUSCULAR

## 2016-04-07 NOTE — Telephone Encounter (Signed)
Patient has scheduled b12 injection (nurse visit) today---her last lab was July/16 resulting at 824---she was recently in with annual wellness visit---are you ok with giving injection today or would you like b12 lab drawn first before injection to see her current level---please advise, I will let patient know

## 2016-04-07 NOTE — Telephone Encounter (Signed)
Fine to give

## 2016-04-09 ENCOUNTER — Ambulatory Visit: Payer: Medicare Other

## 2016-04-12 NOTE — Progress Notes (Signed)
Medical treatment/procedure(s) were performed by non-physician practitioner and as supervising physician I was immediately available for consultation/collaboration. I agree with above. Shamaya Kauer A Lakeisha Waldrop, MD  

## 2016-05-11 ENCOUNTER — Ambulatory Visit (INDEPENDENT_AMBULATORY_CARE_PROVIDER_SITE_OTHER): Payer: Medicare Other | Admitting: General Practice

## 2016-05-11 DIAGNOSIS — E538 Deficiency of other specified B group vitamins: Secondary | ICD-10-CM | POA: Diagnosis not present

## 2016-05-11 MED ORDER — CYANOCOBALAMIN 1000 MCG/ML IJ SOLN
1000.0000 ug | Freq: Once | INTRAMUSCULAR | Status: AC
  Administered 2016-05-11: 1000 ug via INTRAMUSCULAR

## 2016-05-11 NOTE — Progress Notes (Signed)
Medical treatment/procedure(s) were performed by non-physician practitioner and as supervising physician I was immediately available for consultation/collaboration. I agree with above. Elizabeth A Crawford, MD  

## 2016-05-20 ENCOUNTER — Telehealth: Payer: Self-pay

## 2016-05-20 NOTE — Telephone Encounter (Signed)
Patient would like to wait on getting prolia injections for now---patient will call back when she is ready to have insurance verified

## 2016-06-01 ENCOUNTER — Ambulatory Visit (INDEPENDENT_AMBULATORY_CARE_PROVIDER_SITE_OTHER): Payer: Medicare Other | Admitting: Internal Medicine

## 2016-06-01 ENCOUNTER — Encounter: Payer: Self-pay | Admitting: Internal Medicine

## 2016-06-01 VITALS — BP 120/76 | HR 73 | Temp 97.5°F | Resp 12 | Ht 61.5 in | Wt 152.0 lb

## 2016-06-01 DIAGNOSIS — J3089 Other allergic rhinitis: Secondary | ICD-10-CM | POA: Diagnosis not present

## 2016-06-01 DIAGNOSIS — J45909 Unspecified asthma, uncomplicated: Secondary | ICD-10-CM

## 2016-06-01 DIAGNOSIS — J0191 Acute recurrent sinusitis, unspecified: Secondary | ICD-10-CM

## 2016-06-01 MED ORDER — CETIRIZINE HCL 10 MG PO TABS: 10.0000 mg | ORAL_TABLET | Freq: Every day | ORAL | 3 refills | Status: DC

## 2016-06-01 MED ORDER — METHYLPREDNISOLONE ACETATE 40 MG/ML IJ SUSP
40.0000 mg | Freq: Once | INTRAMUSCULAR | Status: AC
  Administered 2016-06-01: 40 mg via INTRAMUSCULAR

## 2016-06-01 MED ORDER — CETIRIZINE HCL 10 MG PO TABS: 10.0000 mg | ORAL_TABLET | Freq: Every day | ORAL | 0 refills | Status: DC

## 2016-06-01 NOTE — Progress Notes (Signed)
   Subjective:    Patient ID: Joann Peterson, female    DOB: [birth year], 71 y.o.   MRN: [id number]  HPI The patient is a 71 YO female coming in for allergy symptoms ongoing for about 2-3 weeks. She has been taking claritin for the symptoms without much relief. She denies fevers or chills. Some clear dripping of her nose. Mild sinus pressure with mild headaches which are relieved with aleve. No SOB or cough. Denies vision or hearing changes. She has had this in the past. Needs to be feeling better by next week if possible due to some obligations in caring for her grandchildren.   Review of Systems  Constitutional: Positive for activity change, appetite change and fatigue. Negative for chills, fever and unexpected weight change.  HENT: Positive for congestion, ear pain, postnasal drip, rhinorrhea, sinus pain, sinus pressure and sore throat. Negative for ear discharge, nosebleeds and trouble swallowing.   Eyes: Negative.   Respiratory: Negative for cough, chest tightness, shortness of breath and wheezing.   Cardiovascular: Negative.   Gastrointestinal: Negative.   Musculoskeletal: Positive for myalgias.  Neurological: Negative.       Objective:   Physical Exam  Constitutional: She is oriented to person, place, and time. She appears well-developed and well-nourished.  HENT:  Head: Normocephalic and atraumatic.  Oropharynx with clear drainage and redness, nose with drainage and mild clear crusting. Sinus pressure.   Eyes: EOM are normal.  Neck: Normal range of motion.  Cardiovascular: Normal rate and regular rhythm.   Pulmonary/Chest: Effort normal and breath sounds normal. No respiratory distress. She has no wheezes. She has no rales.  Abdominal: Soft.  Musculoskeletal: She exhibits no edema.  Neurological: She is alert and oriented to person, place, and time. Coordination normal.  Skin: Skin is warm and dry.   Vitals:   06/01/16 1550  BP: 120/76  Pulse: 73  Resp: 12  Temp: 97.5  F (36.4 C)  TempSrc: Oral  SpO2: 95%  Weight: 152 lb (68.9 kg)  Height: 5' 1.5" (1.562 m)      Assessment & Plan:  Depo-medrol 40 mg IM given at visit.

## 2016-06-01 NOTE — Progress Notes (Signed)
Pre visit review using our clinic review tool, if applicable. No additional management support is needed unless otherwise documented below in the visit note. 

## 2016-06-01 NOTE — Patient Instructions (Signed)
We have given you the shot today to help with the sinuses.   We have sent in the zyrtec (also called cetirizine) to the pharmacy to help with the allergies. You can take it in the morning and use a benadryl at night time to help.

## 2016-06-02 NOTE — Assessment & Plan Note (Signed)
Depo-medrol 40 mg IM given at visit, will not rx antibiotics as no signs of bacterial infection on exam. She will switch from claritin to zyrtec.

## 2016-06-08 ENCOUNTER — Ambulatory Visit (INDEPENDENT_AMBULATORY_CARE_PROVIDER_SITE_OTHER): Payer: Medicare Other | Admitting: General Practice

## 2016-06-08 DIAGNOSIS — E538 Deficiency of other specified B group vitamins: Secondary | ICD-10-CM | POA: Diagnosis not present

## 2016-06-08 MED ORDER — CYANOCOBALAMIN 1000 MCG/ML IJ SOLN
1000.0000 ug | Freq: Once | INTRAMUSCULAR | Status: AC
  Administered 2016-06-08: 1000 ug via INTRAMUSCULAR

## 2016-06-08 NOTE — Progress Notes (Signed)
Medical treatment/procedure(s) were performed by non-physician practitioner and as supervising physician I was immediately available for consultation/collaboration. I agree with above. Elizabeth A Crawford, MD  

## 2016-07-13 ENCOUNTER — Ambulatory Visit (INDEPENDENT_AMBULATORY_CARE_PROVIDER_SITE_OTHER): Payer: Medicare Other | Admitting: General Practice

## 2016-07-13 DIAGNOSIS — E538 Deficiency of other specified B group vitamins: Secondary | ICD-10-CM | POA: Diagnosis not present

## 2016-07-13 MED ORDER — CYANOCOBALAMIN 1000 MCG/ML IJ SOLN
1000.0000 ug | Freq: Once | INTRAMUSCULAR | Status: AC
  Administered 2016-07-13: 1000 ug via INTRAMUSCULAR

## 2016-07-27 ENCOUNTER — Telehealth: Payer: Self-pay | Admitting: Internal Medicine

## 2016-07-27 ENCOUNTER — Other Ambulatory Visit: Payer: Self-pay | Admitting: Internal Medicine

## 2016-07-27 NOTE — Telephone Encounter (Signed)
Pharmacy called and would like a 90 day supply of cetirizine (ZYRTEC) 10 MG tablet    Please advise

## 2016-08-12 ENCOUNTER — Ambulatory Visit: Payer: Medicare Other

## 2016-08-12 ENCOUNTER — Ambulatory Visit (INDEPENDENT_AMBULATORY_CARE_PROVIDER_SITE_OTHER): Payer: Medicare Other

## 2016-08-12 DIAGNOSIS — E538 Deficiency of other specified B group vitamins: Secondary | ICD-10-CM | POA: Diagnosis not present

## 2016-08-12 MED ORDER — CYANOCOBALAMIN 1000 MCG/ML IJ SOLN
1000.0000 ug | Freq: Once | INTRAMUSCULAR | Status: AC
  Administered 2016-08-12: 1000 ug via INTRAMUSCULAR

## 2016-09-07 ENCOUNTER — Other Ambulatory Visit: Payer: Self-pay | Admitting: Family

## 2016-09-07 NOTE — Telephone Encounter (Signed)
Pt called regarding this, would like a refill, please advise   [facility] [address]

## 2016-09-07 NOTE — Telephone Encounter (Signed)
Pt.notified

## 2016-09-14 ENCOUNTER — Ambulatory Visit: Payer: Medicare Other

## 2016-09-14 ENCOUNTER — Ambulatory Visit (INDEPENDENT_AMBULATORY_CARE_PROVIDER_SITE_OTHER): Payer: Medicare Other | Admitting: Family Medicine

## 2016-09-14 ENCOUNTER — Encounter: Payer: Self-pay | Admitting: Family Medicine

## 2016-09-14 VITALS — BP 120/78 | HR 77 | Temp 97.5°F | Ht 61.5 in | Wt 155.0 lb

## 2016-09-14 DIAGNOSIS — R0781 Pleurodynia: Secondary | ICD-10-CM | POA: Diagnosis not present

## 2016-09-14 DIAGNOSIS — E538 Deficiency of other specified B group vitamins: Secondary | ICD-10-CM

## 2016-09-14 MED ORDER — CYANOCOBALAMIN 1000 MCG/ML IJ SOLN
1000.0000 ug | Freq: Once | INTRAMUSCULAR | Status: AC
  Administered 2016-09-14: 1000 ug via INTRAMUSCULAR

## 2016-09-14 MED ORDER — MELOXICAM 7.5 MG PO TABS: 7.5000 mg | ORAL_TABLET | Freq: Every day | ORAL | 0 refills | Status: DC | PRN

## 2016-09-14 NOTE — Patient Instructions (Addendum)
Thank you for coming in,   I have started meloxicam. You can take Tylenol with this but just do not take them at the same time. If she does have any improvement please follow-up with me.   Please feel free to call with any questions or concerns at any time, at [phone]. --Dr. Raeford Razor

## 2016-09-14 NOTE — Progress Notes (Signed)
Joann Peterson - 71 y.o. female MRN [id number]  Date of birth: 1945/06/30  SUBJECTIVE:  Including CC & ROS.  Chief Complaint  Patient presents with  . Back Pain    patient states she was standing over hot tub and fell in snd hit back on the bar to get into the hot tub. back is not hurting as bad as it was but if she raises her arms she gets a pulling pain in her mid back    Joann Peterson is a 71 year old female that is presenting with right thoracic back/rib pain. She reports she fell and hit the side of a railing when she was trying to get into a hot tub. The pain is occurring when she takes a deep breath. She has been wearing a compression around her side help with the pain. She is not currently taking any medications for pain. She denies any shortness of breath. She denies any chest pain. This occurred while she was on vacation. It was about 2 weeks ago and has not gotten completely better. There is some improvement but it is still nagging when she lies on the affected side. The pain is occurring on the right flank region.     Review of Systems  Respiratory: Negative for shortness of breath.   Cardiovascular: Negative for leg swelling.  Musculoskeletal: Positive for back pain. Negative for gait problem and neck pain.  Skin: Negative for color change.  Neurological: Negative for weakness and numbness.  Hematological: Negative for adenopathy.  Psychiatric/Behavioral: Negative for agitation.  Otherwise negative    HISTORY: Past Medical, Surgical, Social, and Family History Reviewed & Updated per EMR.   Pertinent Historical Findings include:  Past Medical History:  Diagnosis Date  . ALLERGIC RHINITIS   . Allergy   . B12 nutritional deficiency dx 07/2010  . Cancer ([place])    skin cancer   . GERD (gastroesophageal reflux disease)   . HYPERLIPIDEMIA   . OA (osteoarthritis)    Left thumb, hand and knees  . OSTEOPOROSIS    DEXA 01/2013: -2.2, on Prolia    Past Surgical History:    Procedure Laterality Date  . COLONOSCOPY  03-25-2011  . DENTAL SURGERY N/A 07/25/2014   had 2 upper right; one upper left; 1 lower left and 2 lower right with grafts and implants to come  . NASAL SINUS SURGERY    . POLYPECTOMY  03-25-2011    Allergies  Allergen Reactions  . Penicillins Other (See Comments)    Pt doesn't know reaction- was 71 years old and told her whole life allergic    Family History  Problem Relation Age of Onset  . Arthritis Mother   . Hyperlipidemia Mother   . Hypertension Mother   . Heart disease Mother   . Arthritis Father   . Hyperlipidemia Father   . Lung cancer Father   . Prostate cancer Father   . Colon polyps Father   . Colon cancer Paternal Grandfather   . Colon cancer Paternal Uncle      Social History   Social History  . Marital status: Married    Spouse name: N/A  . Number of children: N/A  . Years of education: N/A   Occupational History  . Not on file.   Social History Main Topics  . Smoking status: Former Smoker    Packs/day: 1.50    Years: 30.00    Types: Cigarettes    Quit date: 11/01/2008  . Smokeless tobacco: Never Used  Comment: Married, lives with spouse and g-son (raising him). Works as Solicitor at [employer]  . Alcohol use 0.0 oz/week     Comment: Rarely  . Drug use: No  . Sexual activity: Not on file   Other Topics Concern  . Not on file   Social History Narrative  . No narrative on file     PHYSICAL EXAM:  VS: BP 120/78 (BP Location: Left Arm, Patient Position: Sitting, Cuff Size: Normal)   Pulse 77   Temp (!) 97.5 F (36.4 C) (Oral)   Ht 5' 1.5" (1.562 m)   Wt 155 lb (70.3 kg)   SpO2 98%   BMI 28.81 kg/m  Physical Exam Gen: NAD, alert, cooperative with exam, well-appearing ENT: normal lips, normal nasal mucosa,  Eye: normal EOM, normal conjunctiva and lids CV:  no edema, +2 pedal pulses   Resp: no accessory muscle use, non-labored, clear to auscultation bilaterally Skin: no rashes, no  areas of induration  Neuro: normal tone, normal sensation to touch Psych:  normal insight, alert and oriented MSK:  Back: Some tenderness to palpation over the ribs roughly in the T6-T8 range. Normal shoulder abduction and flexion actively. Normal twisting at the waist GERD Normal flexion and extension. Neurovascular intact      ASSESSMENT & PLAN:   Rib pain on right side It is likely she has a contusion related to the fall that she suffered while on vacation. She denies any shortness of breath to suggest a pneumothorax. There is no spinal tenderness to suggest a compression fracture - Initiate mobic - If no improvement she can follow up to have an x-ray

## 2016-09-14 NOTE — Assessment & Plan Note (Signed)
It is likely she has a contusion related to the fall that she suffered while on vacation. She denies any shortness of breath to suggest a pneumothorax. There is no spinal tenderness to suggest a compression fracture - Initiate mobic - If no improvement she can follow up to have an x-ray

## 2016-10-05 ENCOUNTER — Telehealth: Payer: Self-pay | Admitting: Internal Medicine

## 2016-10-05 ENCOUNTER — Other Ambulatory Visit: Payer: Self-pay | Admitting: Internal Medicine

## 2016-10-05 MED ORDER — CETIRIZINE HCL 10 MG PO TABS: 10.0000 mg | ORAL_TABLET | Freq: Every day | ORAL | 3 refills | Status: DC

## 2016-10-05 NOTE — Telephone Encounter (Signed)
Reviewed chart pt is up-to-date sent refills to [facility].../lmb  

## 2016-10-05 NOTE — Telephone Encounter (Signed)
Pt called for a refill of cetirizine (ZYRTEC) 10 MG tablet  Last [facility] 06/2016 [facility] [address]  Please advise

## 2016-10-14 ENCOUNTER — Ambulatory Visit: Payer: Medicare Other

## 2016-10-15 ENCOUNTER — Ambulatory Visit: Payer: Medicare Other

## 2016-10-18 ENCOUNTER — Ambulatory Visit (INDEPENDENT_AMBULATORY_CARE_PROVIDER_SITE_OTHER): Payer: Medicare Other

## 2016-10-18 ENCOUNTER — Telehealth: Payer: Self-pay

## 2016-10-18 DIAGNOSIS — Z23 Encounter for immunization: Secondary | ICD-10-CM

## 2016-10-18 DIAGNOSIS — E538 Deficiency of other specified B group vitamins: Secondary | ICD-10-CM | POA: Diagnosis not present

## 2016-10-18 MED ORDER — CYANOCOBALAMIN 1000 MCG/ML IJ SOLN
1000.0000 ug | Freq: Once | INTRAMUSCULAR | Status: AC
  Administered 2016-10-18: 1000 ug via INTRAMUSCULAR

## 2016-10-18 NOTE — Telephone Encounter (Signed)
Fine to continue.

## 2016-10-18 NOTE — Progress Notes (Signed)
Medical treatment/procedure(s) were performed by non-physician practitioner and as supervising physician I was immediately available for consultation/collaboration. I agree with above. Elizabeth A Crawford, MD  

## 2016-10-18 NOTE — Telephone Encounter (Signed)
Patient scheduled for b12 injection today (nurse visit)--last lab was July/2016 resulting at 824---she was seen by dr schmitz for b12 deficiency in aug/2018 but I dont see lab draw---are you ok with continuing b12 injections--please advise, thanks

## 2016-10-21 ENCOUNTER — Other Ambulatory Visit: Payer: Self-pay | Admitting: Gynecology

## 2016-10-21 DIAGNOSIS — Z1231 Encounter for screening mammogram for malignant neoplasm of breast: Secondary | ICD-10-CM

## 2016-11-17 ENCOUNTER — Ambulatory Visit (INDEPENDENT_AMBULATORY_CARE_PROVIDER_SITE_OTHER): Payer: Medicare Other | Admitting: *Deleted

## 2016-11-17 DIAGNOSIS — E538 Deficiency of other specified B group vitamins: Secondary | ICD-10-CM | POA: Diagnosis not present

## 2016-11-17 MED ORDER — CYANOCOBALAMIN 1000 MCG/ML IJ SOLN
1000.0000 ug | Freq: Once | INTRAMUSCULAR | Status: AC
  Administered 2016-11-17: 1000 ug via INTRAMUSCULAR

## 2016-11-22 NOTE — Progress Notes (Signed)
Medical treatment/procedure(s) were performed by non-physician practitioner and as supervising physician I was immediately available for consultation/collaboration. I agree with above. Fredrica Capano A Trevell Pariseau, MD  

## 2016-12-03 ENCOUNTER — Ambulatory Visit
Admission: RE | Admit: 2016-12-03 | Discharge: 2016-12-03 | Disposition: A | Discharge status: Home / Self Care | Payer: Medicare Other | Source: Ambulatory Visit | Attending: Gynecology | Admitting: Gynecology

## 2016-12-03 DIAGNOSIS — Z1231 Encounter for screening mammogram for malignant neoplasm of breast: Secondary | ICD-10-CM

## 2016-12-21 ENCOUNTER — Ambulatory Visit (INDEPENDENT_AMBULATORY_CARE_PROVIDER_SITE_OTHER): Payer: Medicare Other | Admitting: Emergency Medicine

## 2016-12-21 DIAGNOSIS — E538 Deficiency of other specified B group vitamins: Secondary | ICD-10-CM

## 2016-12-21 MED ORDER — CYANOCOBALAMIN 1000 MCG/ML IJ SOLN
1000.0000 ug | Freq: Once | INTRAMUSCULAR | Status: AC
  Administered 2016-12-21: 1000 ug via INTRAMUSCULAR

## 2016-12-21 NOTE — Progress Notes (Signed)
Medical treatment/procedure(s) were performed by non-physician practitioner and as supervising physician I was immediately available for consultation/collaboration. I agree with above. Serrena Linderman A Yuvraj Pfeifer, MD  

## 2017-01-06 ENCOUNTER — Other Ambulatory Visit: Payer: Self-pay

## 2017-01-20 ENCOUNTER — Ambulatory Visit: Payer: Medicare Other

## 2017-01-20 ENCOUNTER — Ambulatory Visit (INDEPENDENT_AMBULATORY_CARE_PROVIDER_SITE_OTHER): Payer: Medicare Other | Admitting: *Deleted

## 2017-01-20 ENCOUNTER — Encounter: Payer: Self-pay | Admitting: *Deleted

## 2017-01-20 DIAGNOSIS — E538 Deficiency of other specified B group vitamins: Secondary | ICD-10-CM

## 2017-01-20 MED ORDER — CYANOCOBALAMIN 1000 MCG/ML IJ SOLN
1000.0000 ug | Freq: Once | INTRAMUSCULAR | Status: AC
  Administered 2017-01-20: 1000 ug via INTRAMUSCULAR

## 2017-01-20 NOTE — Progress Notes (Signed)
Medical treatment/procedure(s) were performed by non-physician practitioner and as supervising physician I was immediately available for consultation/collaboration. I agree with above. Martice Doty A Dornell Grasmick, MD  

## 2017-02-10 ENCOUNTER — Telehealth: Payer: Self-pay | Admitting: Internal Medicine

## 2017-02-10 NOTE — Telephone Encounter (Signed)
Attempted to call patient to schedule AWV. Mrs. Romas did not answer. Will try to call patient again at a later time. SF

## 2017-02-21 ENCOUNTER — Ambulatory Visit: Payer: Medicare Other

## 2017-02-24 ENCOUNTER — Ambulatory Visit (INDEPENDENT_AMBULATORY_CARE_PROVIDER_SITE_OTHER): Payer: Medicare Other | Admitting: *Deleted

## 2017-02-24 DIAGNOSIS — E538 Deficiency of other specified B group vitamins: Secondary | ICD-10-CM | POA: Diagnosis not present

## 2017-02-24 MED ORDER — CYANOCOBALAMIN 1000 MCG/ML IJ SOLN
1000.0000 ug | Freq: Once | INTRAMUSCULAR | Status: AC
  Administered 2017-02-24: 1000 ug via INTRAMUSCULAR

## 2017-02-24 NOTE — Progress Notes (Signed)
Medical treatment/procedure(s) were performed by non-physician practitioner and as supervising physician I was immediately available for consultation/collaboration. I agree with above. Elizabeth A Crawford, MD  

## 2017-03-03 ENCOUNTER — Ambulatory Visit (INDEPENDENT_AMBULATORY_CARE_PROVIDER_SITE_OTHER): Payer: Medicare Other | Admitting: Family Medicine

## 2017-03-03 ENCOUNTER — Encounter: Payer: Self-pay | Admitting: Family Medicine

## 2017-03-03 VITALS — BP 118/66 | HR 86 | Temp 98.6°F | Ht 61.5 in | Wt 143.0 lb

## 2017-03-03 DIAGNOSIS — R6889 Other general symptoms and signs: Secondary | ICD-10-CM

## 2017-03-03 NOTE — Assessment & Plan Note (Signed)
Possible for flu. Husband was recently in the hospital  - counseled on supportive care - given indications to follow up.

## 2017-03-03 NOTE — Patient Instructions (Signed)
Please try things such as zyrtec-D or allegra-D which is an antihistamine and decongestant.   Please try afrin which will help with nasal congestion but use for only three days.   Please also try using a netti pot on a regular occasion.  Honey can help with a sore throat.

## 2017-03-03 NOTE — Progress Notes (Signed)
Joann Peterson - 72 y.o. female MRN [id number]  Date of birth: 1945/03/21  SUBJECTIVE:  Including CC & ROS.  Chief Complaint  Patient presents with  . Cough    Joann Peterson is a 72 y.o. female that is presenting with cough and body aches. Ongoing for one week. Admits to fevers. She has been taking over the counter medications with no improvement. Denies wheezing. She admits to coughing more at night. Has been in the hospital with her husband. No rash.      Review of Systems  Constitutional: Positive for chills and fever.  HENT: Negative for sore throat.   Respiratory: Positive for cough.   Cardiovascular: Negative for chest pain.  Gastrointestinal: Negative for abdominal pain.  Musculoskeletal: Negative for back pain.  Skin: Negative for color change.  Neurological: Negative for weakness.  Hematological: Negative for adenopathy.  Psychiatric/Behavioral: Negative for agitation.    HISTORY: Past Medical, Surgical, Social, and Family History Reviewed & Updated per EMR.   Pertinent Historical Findings include:  Past Medical History:  Diagnosis Date  . ALLERGIC RHINITIS   . Allergy   . B12 nutritional deficiency dx 07/2010  . Cancer ([place])    skin cancer   . GERD (gastroesophageal reflux disease)   . HYPERLIPIDEMIA   . OA (osteoarthritis)    Left thumb, hand and knees  . OSTEOPOROSIS    DEXA 01/2013: -2.2, on Prolia    Past Surgical History:  Procedure Laterality Date  . COLONOSCOPY  03-25-2011  . DENTAL SURGERY N/A 07/25/2014   had 2 upper right; one upper left; 1 lower left and 2 lower right with grafts and implants to come  . NASAL SINUS SURGERY    . POLYPECTOMY  03-25-2011    Allergies  Allergen Reactions  . Penicillins Other (See Comments)    Pt doesn't know reaction- was 72 years old and told her whole life allergic    Family History  Problem Relation Age of Onset  . Arthritis Mother   . Hyperlipidemia Mother   . Hypertension Mother   . Heart disease  Mother   . Arthritis Father   . Hyperlipidemia Father   . Lung cancer Father   . Prostate cancer Father   . Colon polyps Father   . Colon cancer Paternal Grandfather   . Colon cancer Paternal Uncle      Social History   Socioeconomic History  . Marital status: Married    Spouse name: Not on file  . Number of children: Not on file  . Years of education: Not on file  . Highest education level: Not on file  Social Needs  . Financial resource strain: Not on file  . Food insecurity - worry: Not on file  . Food insecurity - inability: Not on file  . Transportation needs - medical: Not on file  . Transportation needs - non-medical: Not on file  Occupational History  . Not on file  Tobacco Use  . Smoking status: Former Smoker    Packs/day: 1.50    Years: 30.00    Pack years: 45.00    Types: Cigarettes    Last attempt to quit: 11/01/2008    Years since quitting: 8.3  . Smokeless tobacco: Never Used  . Tobacco comment: Married, lives with spouse and g-son (raising him). 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 Alcohol use: Yes    Alcohol/week: 0.0 oz    Comment: Rarely  . Drug use: No  .  Sexual activity: Not on file  Other Topics Concern  . Not on file  Social History Narrative  . Not on file     PHYSICAL EXAM:  VS: BP 118/66 (BP Location: Left Arm, Patient Position: Sitting, Cuff Size: Normal)   Pulse 86   Temp 98.6 F (37 C) (Oral)   Ht 5' 1.5" (1.562 m)   Wt 143 lb (64.9 kg)   SpO2 96%   BMI 26.58 kg/m  Physical Exam Gen: NAD, alert, cooperative with exam,  ENT: normal lips, normal nasal mucosa, tympanic membranes clear and intact bilaterally, normal oropharynx, no cervical lymphadenopathy Eye: normal EOM, normal conjunctiva and lids CV:  no edema, +2 pedal pulses, regular rate and rhythm, S1-S2   Resp: no accessory muscle use, non-labored, clear to auscultation bilaterally, no crackles or wheezes Skin: no rashes, no areas of  induration  Neuro: normal tone, normal sensation to touch Psych:  normal insight, alert and oriented MSK: Normal gait, normal strength       ASSESSMENT & PLAN:   Flu-like symptoms Possible for flu. Husband was recently in the hospital  - counseled on supportive care - given indications to follow up.

## 2017-03-28 ENCOUNTER — Ambulatory Visit (INDEPENDENT_AMBULATORY_CARE_PROVIDER_SITE_OTHER): Payer: Medicare Other

## 2017-03-28 DIAGNOSIS — E538 Deficiency of other specified B group vitamins: Secondary | ICD-10-CM

## 2017-03-28 MED ORDER — CYANOCOBALAMIN 1000 MCG/ML IJ SOLN
1000.0000 ug | Freq: Once | INTRAMUSCULAR | Status: AC
  Administered 2017-03-28: 1000 ug via INTRAMUSCULAR

## 2017-03-28 NOTE — Progress Notes (Signed)
Medical treatment/procedure(s) were performed by non-physician practitioner and as supervising physician I was immediately available for consultation/collaboration. I agree with above. Elizabeth A Crawford, MD  

## 2017-04-10 ENCOUNTER — Other Ambulatory Visit: Payer: Self-pay | Admitting: Internal Medicine

## 2017-04-26 ENCOUNTER — Ambulatory Visit (INDEPENDENT_AMBULATORY_CARE_PROVIDER_SITE_OTHER): Payer: Medicare Other | Admitting: *Deleted

## 2017-04-26 ENCOUNTER — Telehealth: Payer: Self-pay

## 2017-04-26 DIAGNOSIS — E538 Deficiency of other specified B group vitamins: Secondary | ICD-10-CM | POA: Diagnosis not present

## 2017-04-26 MED ORDER — CYANOCOBALAMIN 1000 MCG/ML IJ SOLN
1000.0000 ug | Freq: Once | INTRAMUSCULAR | Status: AC
  Administered 2017-04-26: 1000 ug via INTRAMUSCULAR

## 2017-04-26 NOTE — Telephone Encounter (Signed)
Patient's last b12 in 2016 was normal (800's)---she has continued coming monthly---are you ok with continuing b12 injections (she is scheduled for nurse visit today)---please advise, thanks

## 2017-04-26 NOTE — Telephone Encounter (Signed)
Yes, levels should be normal on replacement.

## 2017-04-26 NOTE — Progress Notes (Signed)
Medical treatment/procedure(s) were performed by non-physician practitioner and as supervising physician I was immediately available for consultation/collaboration. I agree with above. Elizabeth A Crawford, MD  

## 2017-05-11 DIAGNOSIS — H2513 Age-related nuclear cataract, bilateral: Secondary | ICD-10-CM | POA: Diagnosis not present

## 2017-05-11 DIAGNOSIS — H43811 Vitreous degeneration, right eye: Secondary | ICD-10-CM | POA: Diagnosis not present

## 2017-05-11 DIAGNOSIS — H43393 Other vitreous opacities, bilateral: Secondary | ICD-10-CM | POA: Diagnosis not present

## 2017-05-11 DIAGNOSIS — H25013 Cortical age-related cataract, bilateral: Secondary | ICD-10-CM | POA: Diagnosis not present

## 2017-05-27 ENCOUNTER — Ambulatory Visit (INDEPENDENT_AMBULATORY_CARE_PROVIDER_SITE_OTHER): Payer: Medicare Other

## 2017-05-27 DIAGNOSIS — E538 Deficiency of other specified B group vitamins: Secondary | ICD-10-CM

## 2017-05-27 MED ORDER — CYANOCOBALAMIN 1000 MCG/ML IJ SOLN
1000.0000 ug | Freq: Once | INTRAMUSCULAR | Status: AC
  Administered 2017-05-27: 1000 ug via INTRAMUSCULAR

## 2017-05-27 NOTE — Progress Notes (Signed)
Medical treatment/procedure(s) were performed by non-physician practitioner and as supervising physician I was immediately available for consultation/collaboration. I agree with above. Hoyt Koch, MD  Last visit 06/01/16 with PCP and needs yearly visit prior to more B12 shots.

## 2017-06-07 ENCOUNTER — Ambulatory Visit: Payer: Medicare Other | Admitting: Internal Medicine

## 2017-06-28 ENCOUNTER — Ambulatory Visit (INDEPENDENT_AMBULATORY_CARE_PROVIDER_SITE_OTHER): Payer: Medicare Other | Admitting: Internal Medicine

## 2017-06-28 ENCOUNTER — Ambulatory Visit: Payer: Medicare Other

## 2017-06-28 ENCOUNTER — Encounter: Payer: Self-pay | Admitting: Internal Medicine

## 2017-06-28 ENCOUNTER — Other Ambulatory Visit (INDEPENDENT_AMBULATORY_CARE_PROVIDER_SITE_OTHER): Payer: Medicare Other

## 2017-06-28 ENCOUNTER — Telehealth: Payer: Self-pay

## 2017-06-28 VITALS — BP 126/78 | HR 71 | Temp 97.7°F | Ht 61.5 in | Wt 152.0 lb

## 2017-06-28 DIAGNOSIS — E538 Deficiency of other specified B group vitamins: Secondary | ICD-10-CM | POA: Diagnosis not present

## 2017-06-28 DIAGNOSIS — E785 Hyperlipidemia, unspecified: Secondary | ICD-10-CM

## 2017-06-28 DIAGNOSIS — R7301 Impaired fasting glucose: Secondary | ICD-10-CM | POA: Diagnosis not present

## 2017-06-28 DIAGNOSIS — F4323 Adjustment disorder with mixed anxiety and depressed mood: Secondary | ICD-10-CM | POA: Insufficient documentation

## 2017-06-28 DIAGNOSIS — R7303 Prediabetes: Secondary | ICD-10-CM

## 2017-06-28 LAB — COMPREHENSIVE METABOLIC PANEL
ALK PHOS: 118 U/L — AB (ref 39–117)
ALT: 24 U/L (ref 0–35)
AST: 30 U/L (ref 0–37)
Albumin: 4.2 g/dL (ref 3.5–5.2)
BILIRUBIN TOTAL: 0.8 mg/dL (ref 0.2–1.2)
BUN: 13 mg/dL (ref 6–23)
CO2: 29 meq/L (ref 19–32)
Calcium: 9.9 mg/dL (ref 8.4–10.5)
Chloride: 105 mEq/L (ref 96–112)
Creatinine, Ser: 0.99 mg/dL (ref 0.40–1.20)
GFR: 58.59 mL/min — ABNORMAL LOW (ref >60.00)
GLUCOSE: 107 mg/dL — AB (ref 70–99)
Potassium: 4.7 mEq/L (ref 3.5–5.1)
SODIUM: 142 meq/L (ref 135–145)
TOTAL PROTEIN: 7.2 g/dL (ref 6.0–8.3)

## 2017-06-28 LAB — CBC
HCT: 46.2 % — ABNORMAL HIGH (ref 36.0–46.0)
Hemoglobin: 15.9 g/dL — ABNORMAL HIGH (ref 12.0–15.0)
MCHC: 34.4 g/dL (ref 30.0–36.0)
MCV: 96.2 fl (ref 78.0–100.0)
Platelets: 234 10*3/uL (ref 150.0–400.0)
RBC: 4.8 Mil/uL (ref 3.87–5.11)
RDW: 13.1 % (ref 11.5–15.5)
WBC: 6.2 10*3/uL (ref 4.0–10.5)

## 2017-06-28 LAB — LIPID PANEL
Cholesterol: 178 mg/dL (ref 0–200)
HDL: 42.5 mg/dL (ref >39.00)
NONHDL: 135.89
Total CHOL/HDL Ratio: 4
Triglycerides: 302 mg/dL — ABNORMAL HIGH (ref 0.0–149.0)
VLDL: 60.4 mg/dL — AB (ref 0.0–40.0)

## 2017-06-28 LAB — LDL CHOLESTEROL, DIRECT: LDL DIRECT: 98 mg/dL

## 2017-06-28 LAB — HEMOGLOBIN A1C: HEMOGLOBIN A1C: 6 % (ref 4.6–6.5)

## 2017-06-28 MED ORDER — SIMVASTATIN 40 MG PO TABS: 40.0000 mg | ORAL_TABLET | Freq: Every day | ORAL | 3 refills | Status: DC

## 2017-06-28 MED ORDER — ALPRAZOLAM 0.5 MG PO TBDP: 0.5000 mg | ORAL_TABLET | Freq: Two times a day (BID) | ORAL | 0 refills | Status: AC | PRN

## 2017-06-28 MED ORDER — CYANOCOBALAMIN 1000 MCG/ML IJ SOLN
1000.0000 ug | Freq: Once | INTRAMUSCULAR | Status: AC
  Administered 2017-06-28: 1000 ug via INTRAMUSCULAR

## 2017-06-28 NOTE — Telephone Encounter (Signed)
Copied from [place] [id number]. Topic: Referral - Request >> Jun 28, 2017  4:16 PM Bea Graff, NT wrote: Reason for CRM: Pt would like a referral request to a nutritionist for her pre-diabetic diagnosis.

## 2017-06-28 NOTE — Assessment & Plan Note (Signed)
Checking lipid panel, last year's was off meds. She is now taking her simvastatin again. Adjust as needed.

## 2017-06-28 NOTE — Assessment & Plan Note (Signed)
B12 shot given at visit and recommend to continue with monthly B12 shots. Does not need level for monitoring as prior at goal.

## 2017-06-28 NOTE — Assessment & Plan Note (Signed)
Rx for alprazolam and encouraged her to seek counseling services. Also encouraged her to seek palliative care services for her husband to help lift some burden from herself. She is not ready to set any limits with family members at this time. Talked to her about self care.

## 2017-06-28 NOTE — Patient Instructions (Addendum)
We have sent in a refill of the alprazolam and check the labs today.   Think strongly about checking if palliative services for your husband is an option from his doctor as this could help you out a lot.   Come back for a physical sometime in the next couple of months.

## 2017-06-28 NOTE — Progress Notes (Signed)
   Subjective:    Patient ID: Joann Peterson, female    DOB: April 03, 1945, 72 y.o.   MRN: [id number]  HPI The patient is a 72 YO female coming in for several concerns including B12 deficiency (needs injection today, has low levels in the past, gets monthly B12 shot with therapeutic level on shots), and new problem of stress (she is having a lot of stress lately, husband sick and some lashing out, grandchild with adhd and oppositional, grandchild with 3 children and no car needing a lot of support, she does not have a support network, not sleeping well, has an old bottle of xanax which she takes 1/2 pill as needed, rare usage, wants to know if she could get refill, is tired all the time, denies SI/HI), and her cholesterol (needs refill of simvastatin, needs lipid check, denies muscle aches, chest pain, stroke symptoms).   Review of Systems  Constitutional: Positive for activity change and fatigue. Negative for appetite change, fever and unexpected weight change.  HENT: Negative.   Eyes: Negative.   Respiratory: Negative for cough, chest tightness and shortness of breath.   Cardiovascular: Negative for chest pain, palpitations and leg swelling.  Gastrointestinal: Negative for abdominal distention, abdominal pain, constipation, diarrhea, nausea and vomiting.  Musculoskeletal: Negative.   Skin: Negative.   Neurological: Negative.   Psychiatric/Behavioral: Positive for decreased concentration, dysphoric mood and sleep disturbance. Negative for agitation, behavioral problems, hallucinations, self-injury and suicidal ideas. The patient is not nervous/anxious and is not hyperactive.       Objective:   Physical Exam  Constitutional: She is oriented to person, place, and time. She appears well-developed and well-nourished.  HENT:  Head: Normocephalic and atraumatic.  Eyes: EOM are normal.  Neck: Normal range of motion.  Cardiovascular: Normal rate and regular rhythm.  Pulmonary/Chest: Effort normal  and breath sounds normal. No respiratory distress. She has no wheezes. She has no rales.  Abdominal: Soft. Bowel sounds are normal. She exhibits no distension. There is no tenderness. There is no rebound.  Musculoskeletal: She exhibits no edema.  Neurological: She is alert and oriented to person, place, and time. Coordination normal.  Skin: Skin is warm and dry.  Psychiatric:  Tearful during the visit   Vitals:   06/28/17 0909  BP: 126/78  Pulse: 71  Temp: 97.7 F (36.5 C)  TempSrc: Oral  SpO2: 97%  Weight: 152 lb (68.9 kg)  Height: 5' 1.5" (1.562 m)      Assessment & Plan:  B12 shot given at visit

## 2017-06-29 NOTE — Telephone Encounter (Signed)
LVM informing patient.

## 2017-06-29 NOTE — Telephone Encounter (Signed)
Placed referral  

## 2017-07-04 ENCOUNTER — Telehealth: Payer: Self-pay | Admitting: Emergency Medicine

## 2017-07-04 NOTE — Telephone Encounter (Signed)
Called patient to schedule AWV. Patient declined at this time. 

## 2017-07-22 ENCOUNTER — Encounter: Payer: Medicare Other | Admitting: Registered"

## 2017-08-01 ENCOUNTER — Ambulatory Visit (INDEPENDENT_AMBULATORY_CARE_PROVIDER_SITE_OTHER): Payer: Medicare Other

## 2017-08-01 DIAGNOSIS — E538 Deficiency of other specified B group vitamins: Secondary | ICD-10-CM

## 2017-08-01 MED ORDER — CYANOCOBALAMIN 1000 MCG/ML IJ SOLN
1000.0000 ug | Freq: Once | INTRAMUSCULAR | Status: AC
  Administered 2017-08-01: 1000 ug via INTRAMUSCULAR

## 2017-08-01 NOTE — Progress Notes (Signed)
b12 Injection given.   Ahmia Colford J Devlin Mcveigh, MD  

## 2017-08-20 ENCOUNTER — Other Ambulatory Visit: Payer: Self-pay | Admitting: Internal Medicine

## 2017-09-02 ENCOUNTER — Ambulatory Visit (INDEPENDENT_AMBULATORY_CARE_PROVIDER_SITE_OTHER): Payer: Medicare Other

## 2017-09-02 DIAGNOSIS — E538 Deficiency of other specified B group vitamins: Secondary | ICD-10-CM

## 2017-09-02 MED ORDER — CYANOCOBALAMIN 1000 MCG/ML IJ SOLN
1000.0000 ug | Freq: Once | INTRAMUSCULAR | Status: AC
  Administered 2017-09-02: 1000 ug via INTRAMUSCULAR

## 2017-10-06 ENCOUNTER — Ambulatory Visit (INDEPENDENT_AMBULATORY_CARE_PROVIDER_SITE_OTHER): Payer: Medicare Other

## 2017-10-06 DIAGNOSIS — E538 Deficiency of other specified B group vitamins: Secondary | ICD-10-CM | POA: Diagnosis not present

## 2017-10-06 DIAGNOSIS — Z23 Encounter for immunization: Secondary | ICD-10-CM | POA: Diagnosis not present

## 2017-10-06 MED ORDER — CYANOCOBALAMIN 1000 MCG/ML IJ SOLN
1000.0000 ug | Freq: Once | INTRAMUSCULAR | Status: AC
  Administered 2017-10-06: 1000 ug via INTRAMUSCULAR

## 2017-10-06 NOTE — Progress Notes (Signed)
Medical treatment/procedure(s) were performed by non-physician practitioner and as supervising physician I was immediately available for consultation/collaboration. I agree with above. Zarina Pe A Dimitrios Balestrieri, MD  

## 2017-10-07 ENCOUNTER — Ambulatory Visit: Payer: Medicare Other

## 2017-10-24 ENCOUNTER — Other Ambulatory Visit: Payer: Self-pay | Admitting: Gynecology

## 2017-10-24 DIAGNOSIS — Z1231 Encounter for screening mammogram for malignant neoplasm of breast: Secondary | ICD-10-CM

## 2017-11-07 ENCOUNTER — Ambulatory Visit (INDEPENDENT_AMBULATORY_CARE_PROVIDER_SITE_OTHER): Payer: Medicare Other | Admitting: *Deleted

## 2017-11-07 DIAGNOSIS — E538 Deficiency of other specified B group vitamins: Secondary | ICD-10-CM | POA: Diagnosis not present

## 2017-11-07 MED ORDER — CYANOCOBALAMIN 1000 MCG/ML IJ SOLN
1000.0000 ug | Freq: Once | INTRAMUSCULAR | Status: AC
  Administered 2017-11-07: 1000 ug via INTRAMUSCULAR

## 2017-11-07 NOTE — Progress Notes (Signed)
Medical treatment/procedure(s) were performed by non-physician practitioner and as supervising physician I was immediately available for consultation/collaboration. I agree with above. Elizabeth A Crawford, MD  

## 2017-12-05 ENCOUNTER — Ambulatory Visit
Admission: RE | Admit: 2017-12-05 | Discharge: 2017-12-05 | Disposition: A | Discharge status: Home / Self Care | Payer: Medicare Other | Source: Ambulatory Visit | Attending: Gynecology | Admitting: Gynecology

## 2017-12-05 DIAGNOSIS — Z1231 Encounter for screening mammogram for malignant neoplasm of breast: Secondary | ICD-10-CM

## 2017-12-09 ENCOUNTER — Ambulatory Visit (INDEPENDENT_AMBULATORY_CARE_PROVIDER_SITE_OTHER): Payer: Medicare Other | Admitting: *Deleted

## 2017-12-09 DIAGNOSIS — E538 Deficiency of other specified B group vitamins: Secondary | ICD-10-CM | POA: Diagnosis not present

## 2017-12-09 MED ORDER — CYANOCOBALAMIN 1000 MCG/ML IJ SOLN
1000.0000 ug | Freq: Once | INTRAMUSCULAR | Status: AC
  Administered 2017-12-09: 1000 ug via INTRAMUSCULAR

## 2017-12-09 NOTE — Progress Notes (Signed)
Medical treatment/procedure(s) were performed by non-physician practitioner and as supervising physician I was immediately available for consultation/collaboration. I agree with above. Corby Villasenor A Dontaye Hur, MD  

## 2018-01-09 ENCOUNTER — Telehealth: Payer: Self-pay

## 2018-01-09 ENCOUNTER — Ambulatory Visit (INDEPENDENT_AMBULATORY_CARE_PROVIDER_SITE_OTHER): Payer: Medicare Other

## 2018-01-09 DIAGNOSIS — E538 Deficiency of other specified B group vitamins: Secondary | ICD-10-CM

## 2018-01-09 MED ORDER — CYANOCOBALAMIN 1000 MCG/ML IJ SOLN
1000.0000 ug | Freq: Once | INTRAMUSCULAR | Status: AC
  Administered 2018-01-09: 1000 ug via INTRAMUSCULAR

## 2018-01-09 NOTE — Telephone Encounter (Signed)
Pt has been seen this year, ok to give b12

## 2018-01-09 NOTE — Progress Notes (Signed)
Medical treatment/procedure(s) were performed by non-physician practitioner and as supervising physician I was immediately available for consultation/collaboration. I agree with above. Weslee Prestage A Arville Postlewaite, MD  

## 2018-01-09 NOTE — Telephone Encounter (Signed)
As long as she has had yearly visit fine

## 2018-01-09 NOTE — Telephone Encounter (Signed)
Routing to dr crawford---pt coming in for b12 injection (nurse visit)---last b12 lab was normal in 2016---are you ok with patient continuing injections and/or do you want patient to get lab first----please advise, thanks

## 2018-01-31 IMAGING — MG 2D DIGITAL SCREENING BILATERAL MAMMOGRAM WITH CAD AND ADJUNCT TO
8 of 13 series · 8 of 29 positions shown · non-contrast
Comparison: Previous exam(s).

CLINICAL DATA: Screening.

EXAM:
2D DIGITAL SCREENING BILATERAL MAMMOGRAM WITH CAD AND ADJUNCT TOMO

[L MLO (1 of 2)]
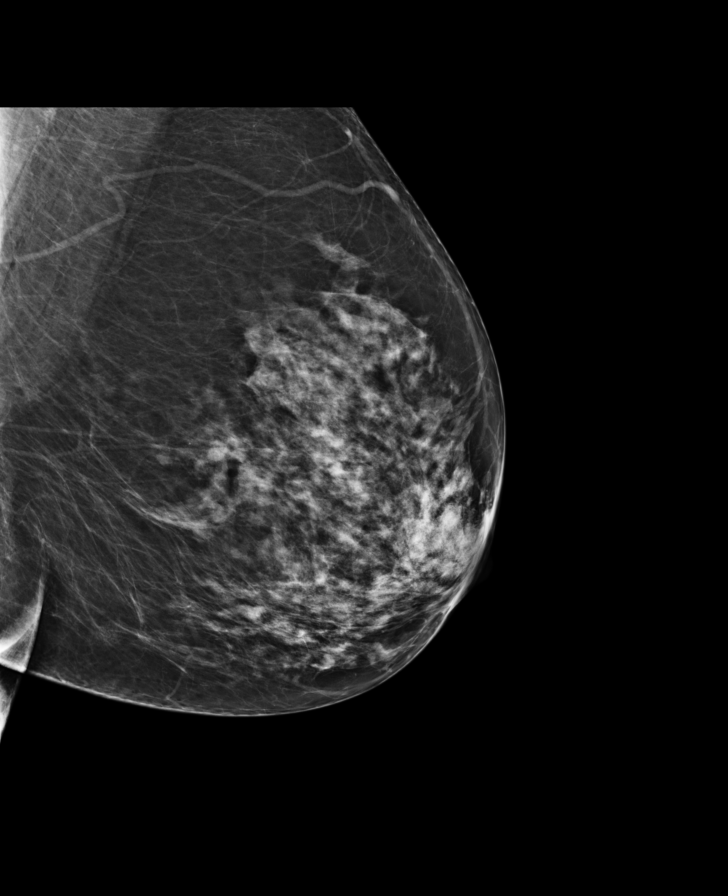

[L MLO synth-2D]
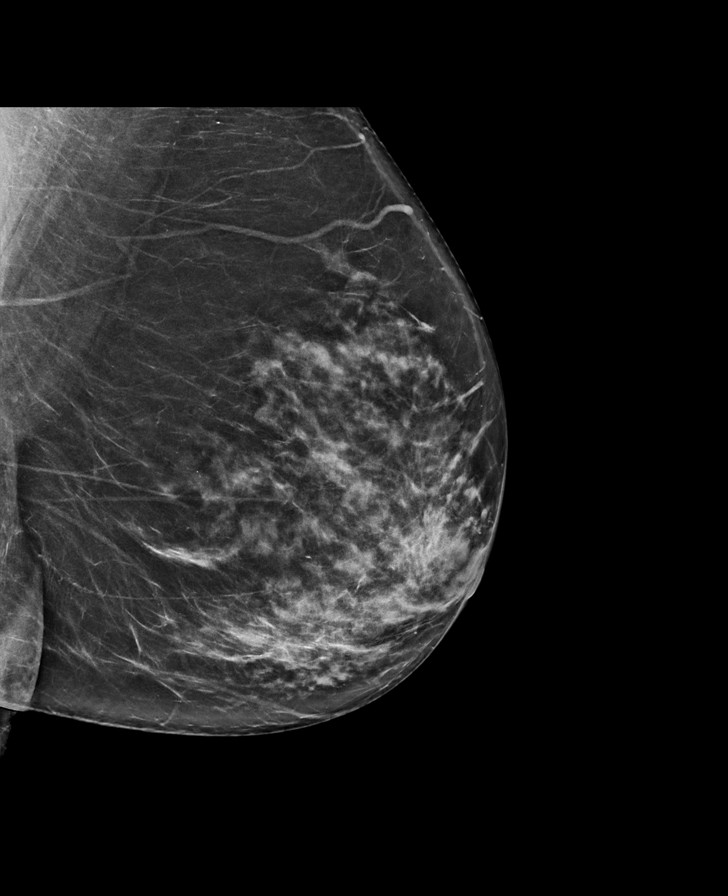

[L CC]
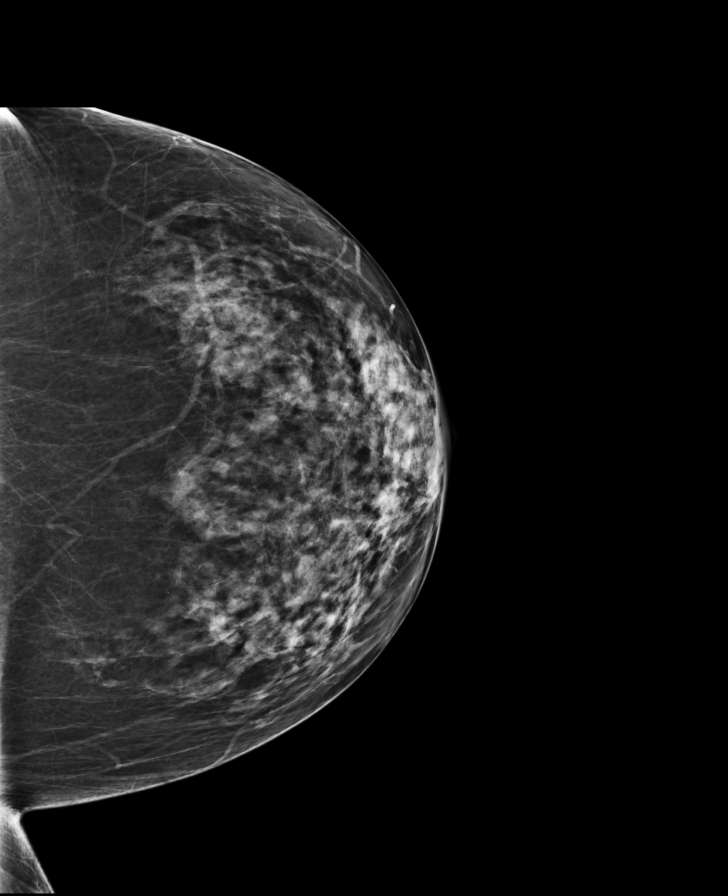

[R CC synth-2D]
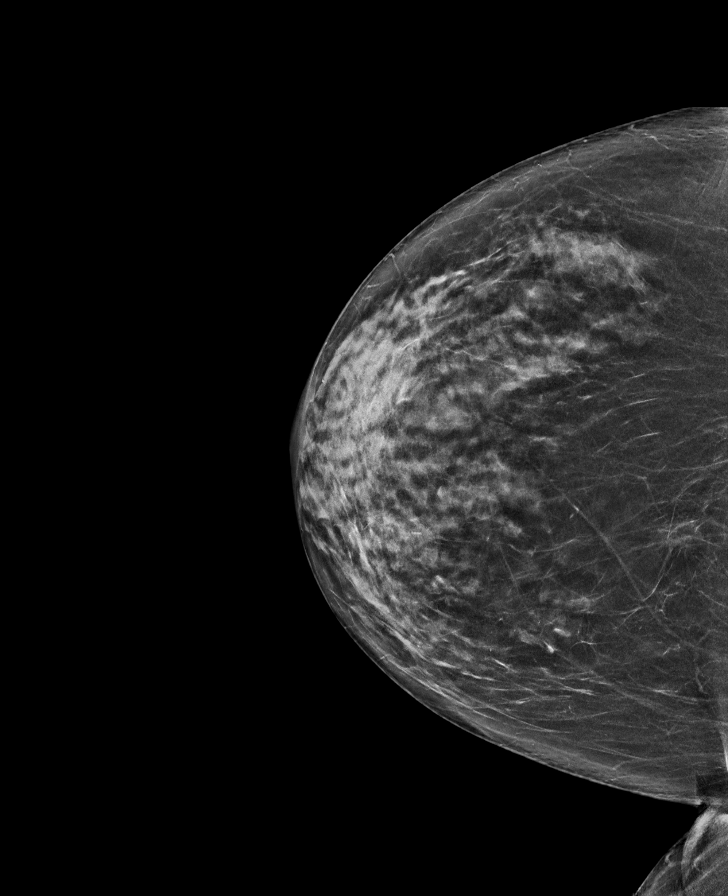

[R MLO]
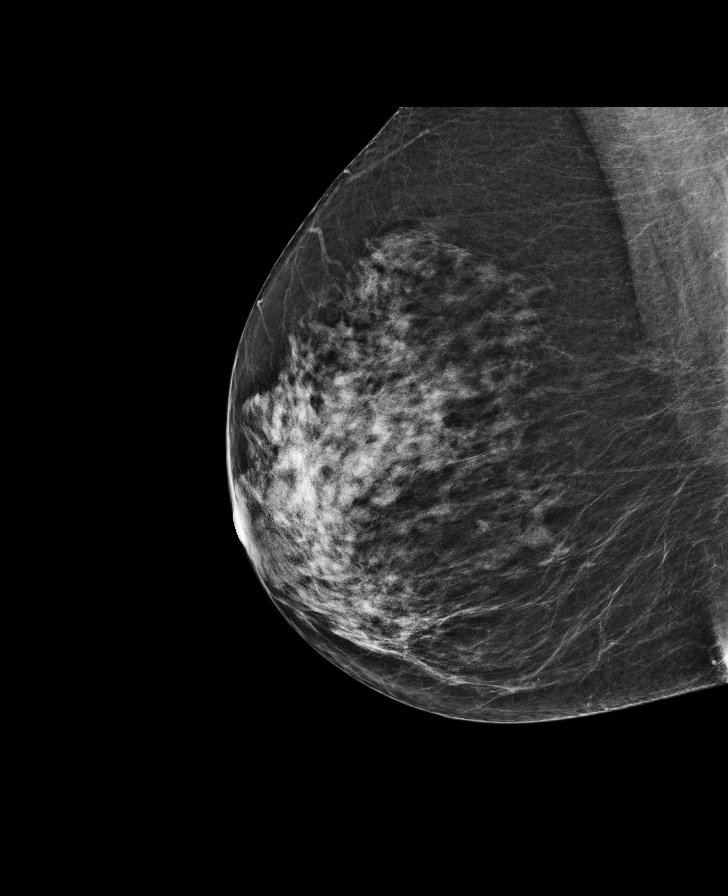

[R MLO synth-2D]
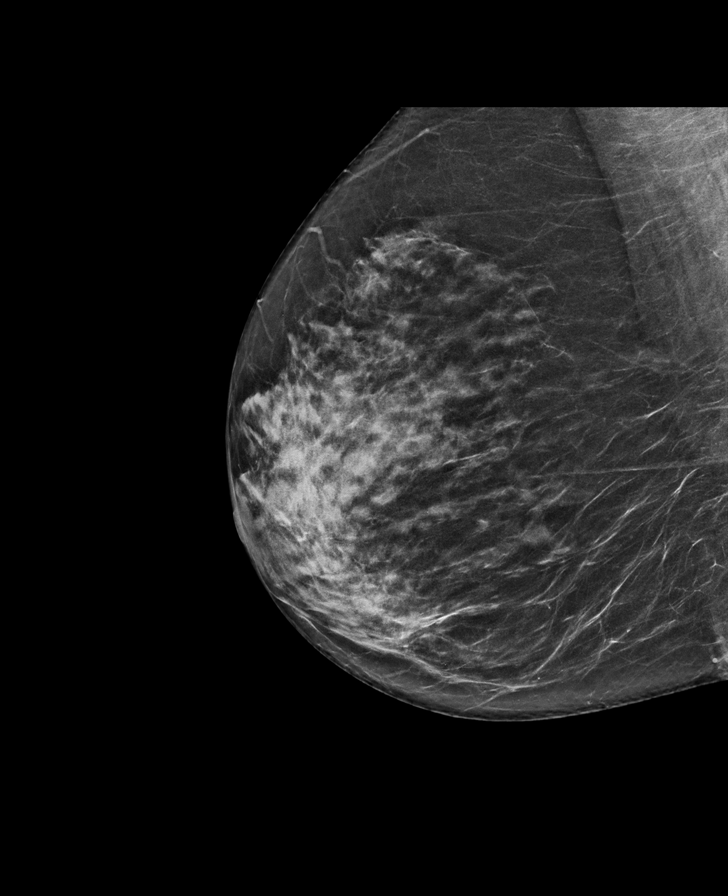

[R CC]
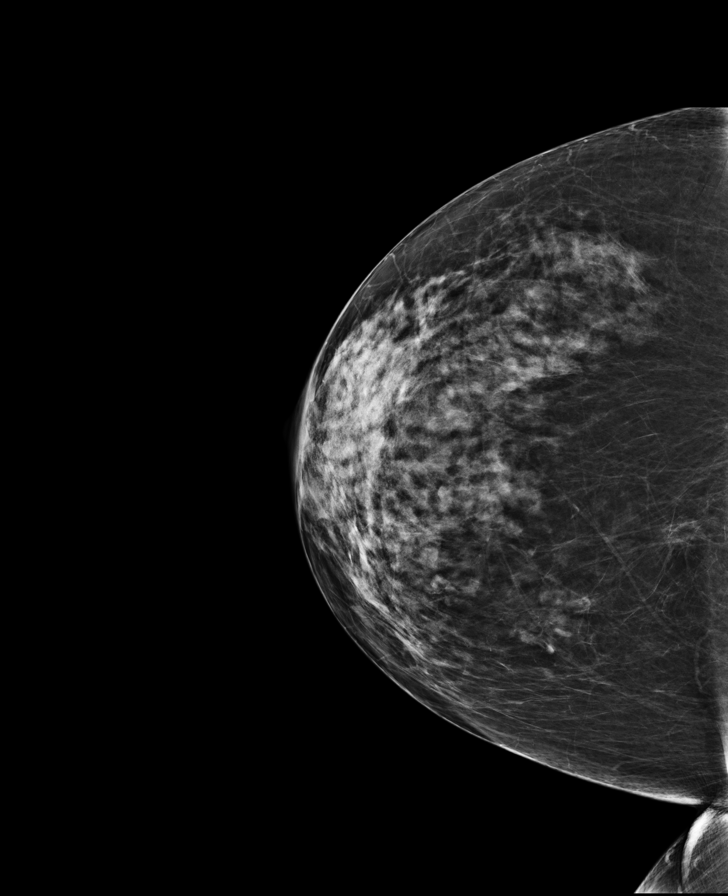

[L MLO (2 of 2)]
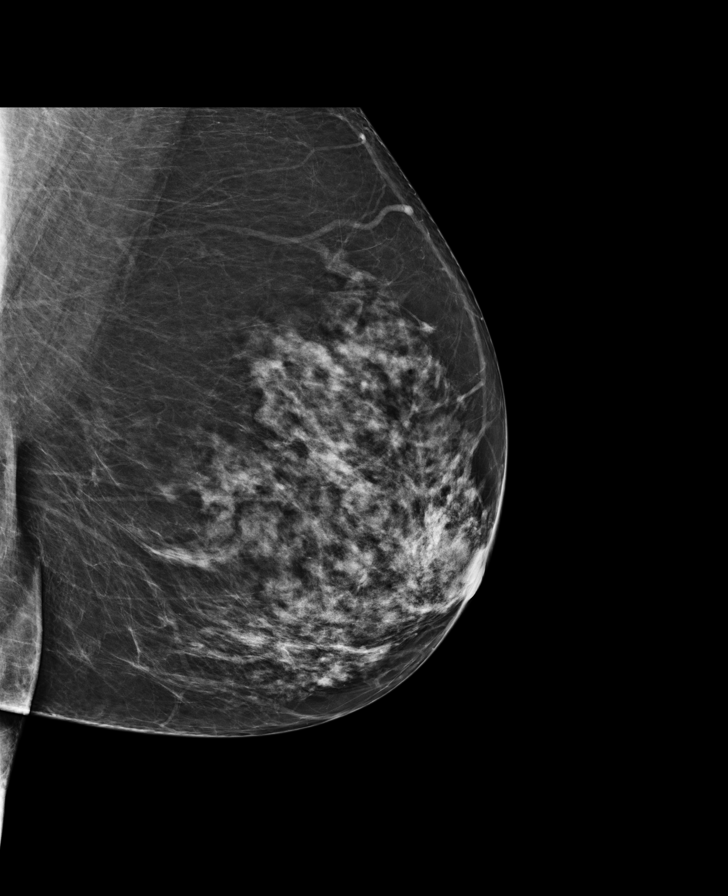

[8 of 29 positions shown; findings below may reference images not displayed]

ACR Breast Density Category c: The breast tissue is heterogeneously
dense, which may obscure small masses.
FINDINGS: There are no findings suspicious for malignancy. Images were
processed with CAD.
IMPRESSION: No mammographic evidence of malignancy. A result letter of this
screening mammogram will be mailed directly to the patient.

RECOMMENDATION:
Screening mammogram in one year. (Code:[id number])

BI-RADS CATEGORY  1: Negative.

## 2018-02-10 ENCOUNTER — Ambulatory Visit (INDEPENDENT_AMBULATORY_CARE_PROVIDER_SITE_OTHER): Payer: Medicare Other | Admitting: Internal Medicine

## 2018-02-10 ENCOUNTER — Encounter: Payer: Self-pay | Admitting: Internal Medicine

## 2018-02-10 ENCOUNTER — Ambulatory Visit: Payer: Medicare Other

## 2018-02-10 VITALS — BP 100/70 | HR 72 | Temp 97.8°F | Ht 61.5 in | Wt 149.0 lb

## 2018-02-10 DIAGNOSIS — E538 Deficiency of other specified B group vitamins: Secondary | ICD-10-CM | POA: Diagnosis not present

## 2018-02-10 DIAGNOSIS — R5383 Other fatigue: Secondary | ICD-10-CM

## 2018-02-10 MED ORDER — CYANOCOBALAMIN 1000 MCG/ML IJ SOLN
1000.0000 ug | Freq: Once | INTRAMUSCULAR | Status: AC
  Administered 2018-02-10: 1000 ug via INTRAMUSCULAR

## 2018-02-10 NOTE — Patient Instructions (Signed)
We have given you the B12 shot today. ?

## 2018-02-10 NOTE — Assessment & Plan Note (Signed)
Given B12 shot today

## 2018-02-10 NOTE — Progress Notes (Signed)
   Subjective:   Patient ID: Joann Peterson, female    DOB: 1945/03/06, 73 y.o.   MRN: [id number]  HPI The patient is a 73 y.o. female coming in for cold symptoms. Started 1-2 weeks ago and resolved about 3-4 days ago. Main symptoms are: currently fatigue, during cold she was having cough and SOB and nose drainage and muscle aches and fevers/chills. Denies SOB or current cough. Overall it is improving. Has tried cold medicine at night time and taking zyrtec daily.   Review of Systems  Constitutional: Positive for activity change, appetite change, chills and fatigue. Negative for fever and unexpected weight change.  HENT: Positive for congestion, postnasal drip, rhinorrhea and sinus pressure. Negative for ear discharge, ear pain, sinus pain, sneezing, sore throat, tinnitus, trouble swallowing and voice change.   Eyes: Negative.   Respiratory: Positive for cough. Negative for chest tightness, shortness of breath and wheezing.   Cardiovascular: Negative.   Gastrointestinal: Negative.   Musculoskeletal: Positive for myalgias.  Neurological: Negative.     Objective:  Physical Exam Constitutional:      Appearance: She is well-developed.  HENT:     Head: Normocephalic and atraumatic.     Comments: Oropharynx with redness, nose with swollen turbinates, TMs normal bilaterally.  Neck:     Musculoskeletal: Normal range of motion.     Thyroid: No thyromegaly.  Cardiovascular:     Rate and Rhythm: Normal rate and regular rhythm.  Pulmonary:     Effort: Pulmonary effort is normal. No respiratory distress.     Breath sounds: Normal breath sounds. No wheezing or rales.  Abdominal:     Palpations: Abdomen is soft.  Musculoskeletal:        General: No tenderness.  Lymphadenopathy:     Cervical: No cervical adenopathy.  Skin:    General: Skin is warm and dry.  Neurological:     Mental Status: She is alert and oriented to person, place, and time.     Vitals:   02/10/18 0837  BP: 100/70    Pulse: 72  Temp: 97.8 F (36.6 C)  TempSrc: Oral  SpO2: 94%  Weight: 149 lb (67.6 kg)  Height: 5' 1.5" (1.562 m)    Assessment & Plan:  B12 shot given

## 2018-02-10 NOTE — Assessment & Plan Note (Signed)
Likely post viral and is gradually improving. Reassurance given that she will continue to improve over the next several weeks.

## 2018-02-19 ENCOUNTER — Other Ambulatory Visit: Payer: Self-pay | Admitting: Internal Medicine

## 2018-03-14 ENCOUNTER — Ambulatory Visit (INDEPENDENT_AMBULATORY_CARE_PROVIDER_SITE_OTHER): Payer: Medicare Other

## 2018-03-14 DIAGNOSIS — E538 Deficiency of other specified B group vitamins: Secondary | ICD-10-CM

## 2018-03-14 MED ORDER — CYANOCOBALAMIN 1000 MCG/ML IJ SOLN
1000.0000 ug | Freq: Once | INTRAMUSCULAR | Status: AC
  Administered 2018-03-14: 1000 ug via INTRAMUSCULAR

## 2018-03-14 NOTE — Progress Notes (Signed)
Medical treatment/procedure(s) were performed by non-physician practitioner and as supervising physician I was immediately available for consultation/collaboration. I agree with above. Guillermo Nehring A Skyeler Smola, MD  

## 2018-04-12 ENCOUNTER — Other Ambulatory Visit: Payer: Self-pay

## 2018-04-12 ENCOUNTER — Ambulatory Visit (INDEPENDENT_AMBULATORY_CARE_PROVIDER_SITE_OTHER): Payer: Medicare Other

## 2018-04-12 ENCOUNTER — Telehealth: Payer: Self-pay

## 2018-04-12 DIAGNOSIS — E538 Deficiency of other specified B group vitamins: Secondary | ICD-10-CM

## 2018-04-12 MED ORDER — CYANOCOBALAMIN 1000 MCG/ML IJ SOLN
1000.0000 ug | Freq: Once | INTRAMUSCULAR | Status: AC
  Administered 2018-04-12: 1000 ug via INTRAMUSCULAR

## 2018-04-12 NOTE — Telephone Encounter (Signed)
Patient is scheduled for b12 injection today (nurse visit)--last b12 lab was 2016---are you ok with continuing b12 monthly injections, or do you need b12 lab first today?  Please advise, I will let patient know, thanks

## 2018-04-12 NOTE — Telephone Encounter (Signed)
As long as visit with PCP within last year ok to continue.

## 2018-04-12 NOTE — Telephone Encounter (Signed)
Advised patient additional lab work not needed per dr Sharlet Salina, patient has kept regular appts

## 2018-04-13 NOTE — Progress Notes (Signed)
Medical treatment/procedure(s) were performed by non-physician practitioner and as supervising physician I was immediately available for consultation/collaboration. I agree with above. Laporchia Nakajima A Zuriyah Shatz, MD  

## 2018-05-02 ENCOUNTER — Telehealth: Payer: Self-pay | Admitting: *Deleted

## 2018-05-02 NOTE — Telephone Encounter (Signed)
Called patient to inform them the nurse needs to either convert their upcoming AWV to a virtual visit or reschedule the visit out into the future due to covid-19 safety measures. AWV rescheduled for 07/04/18.

## 2018-05-15 ENCOUNTER — Ambulatory Visit: Payer: Medicare Other

## 2018-05-24 ENCOUNTER — Ambulatory Visit: Payer: Medicare Other

## 2018-06-29 ENCOUNTER — Ambulatory Visit (INDEPENDENT_AMBULATORY_CARE_PROVIDER_SITE_OTHER): Payer: Medicare Other | Admitting: *Deleted

## 2018-06-29 ENCOUNTER — Other Ambulatory Visit: Payer: Self-pay

## 2018-06-29 DIAGNOSIS — Z0000 Encounter for general adult medical examination without abnormal findings: Secondary | ICD-10-CM | POA: Diagnosis not present

## 2018-06-29 MED ORDER — SIMVASTATIN 40 MG PO TABS: 40.0000 mg | ORAL_TABLET | Freq: Every day | ORAL | 3 refills | Status: DC

## 2018-06-29 MED ORDER — LEVOCETIRIZINE DIHYDROCHLORIDE 5 MG PO TABS: 5.0000 mg | ORAL_TABLET | Freq: Every evening | ORAL | 0 refills | Status: DC

## 2018-06-29 NOTE — Progress Notes (Signed)
Medical screening examination/treatment/procedure(s) were performed by non-physician practitioner and as supervising physician I was immediately available for consultation/collaboration. I agree with above. Broderick Fonseca A Kristen Bushway, MD 

## 2018-06-29 NOTE — Patient Instructions (Addendum)
Continue doing brain stimulating activities (puzzles, reading, adult coloring books, staying active) to keep memory sharp.   Continue to eat heart healthy diet (full of fruits, vegetables, whole grains, lean protein, water--limit salt, fat, and sugar intake) and increase physical activity as tolerated.  Bring a copy of your living will and/or healthcare power of attorney to your next office visit.  If you cannot attend class in person, you can still exercise at home. Video taped versions of [employer] classes are shown on [employer] (GTN) at 8 am and 1 pm Mondays through Fridays. You can also purchase a copy of the [employer] DVD by calling [employer] (GTN) [employer]. GTN is available on [employer] channel 13 with a digital cable box and on [employer] channel 31. GTN is also available on [employer], channel 99. To view GTN, go to channel 99, press OK, select [place], then select GT[employer] to start the channel.   [name] , Thank you for taking time to come for your Medicare Wellness Visit. I appreciate your ongoing commitment to your health goals. Please review the following plan we discussed and let me know if I can assist you in the future.   These are the goals we discussed: Goals    . Patient Stated     Lose weight by increasing exercise, monitoring diet to be healthy by decreasing sugar and carbohydrates.        This is a list of the screening recommended for you and due dates:  Health Maintenance  Topic Date Due  .  Hepatitis C: One time screening is recommended by Center for Disease Control  (CDC) for  adults born from [birth year] through 1965.   1946-01-11  . Flu Shot  09/02/2018  . Colon Cancer Screening  05/15/2019  . Mammogram  12/06/2019  . Tetanus Vaccine  08/07/2023  . DEXA scan (bone density measurement)  Completed  . Pneumonia vaccines  Completed    Preventive Care 27 Years and Older, Female Preventive care refers to lifestyle  choices and visits with your health care provider that can promote health and wellness. What does preventive care include?  A yearly physical exam. This is also called an annual well check.  Dental exams once or twice a year.  Routine eye exams. Ask your health care provider how often you should have your eyes checked.  Personal lifestyle choices, including: ? Daily care of your teeth and gums. ? Regular physical activity. ? Eating a healthy diet. ? Avoiding tobacco and drug use. ? Limiting alcohol use. ? Practicing safe sex. ? Taking low-dose aspirin every day. ? Taking vitamin and mineral supplements as recommended by your health care provider. What happens during an annual well check? The services and screenings done by your health care provider during your annual well check will depend on your age, overall health, lifestyle risk factors, and family history of disease. Counseling Your health care provider may ask you questions about your:  Alcohol use.  Tobacco use.  Drug use.  Emotional well-being.  Home and relationship well-being.  Sexual activity.  Eating habits.  History of falls.  Memory and ability to understand (cognition).  Work and work Statistician.  Reproductive health.  Screening You may have the following tests or measurements:  Height, weight, and BMI.  Blood pressure.  Lipid and cholesterol levels. These may be checked every 5 years, or more frequently if you are over 36 years old.  Skin check.  Lung cancer screening. You  may have this screening every year starting at age 4 if you have a 30-pack-year history of smoking and currently smoke or have quit within the past 15 years.  Colorectal cancer screening. All adults should have this screening starting at age 55 and continuing until age 68. You will have tests every 1-10 years, depending on your results and the type of screening test. People at increased risk should start screening at an  earlier age. Screening tests may include: ? Guaiac-based fecal occult blood testing. ? Fecal immunochemical test (FIT). ? Stool DNA test. ? Virtual colonoscopy. ? Sigmoidoscopy. During this test, a flexible tube with a tiny camera (sigmoidoscope) is used to examine your rectum and lower colon. The sigmoidoscope is inserted through your anus into your rectum and lower colon. ? Colonoscopy. During this test, a long, thin, flexible tube with a tiny camera (colonoscope) is used to examine your entire colon and rectum.  Hepatitis C blood test.  Hepatitis B blood test.  Sexually transmitted disease (STD) testing.  Diabetes screening. This is done by checking your blood sugar (glucose) after you have not eaten for a while (fasting). You may have this done every 1-3 years.  Bone density scan. This is done to screen for osteoporosis. You may have this done starting at age 28.  Mammogram. This may be done every 1-2 years. Talk to your health care provider about how often you should have regular mammograms. Talk with your health care provider about your test results, treatment options, and if necessary, the need for more tests. Vaccines Your health care provider may recommend certain vaccines, such as:  Influenza vaccine. This is recommended every year.  Tetanus, diphtheria, and acellular pertussis (Tdap, Td) vaccine. You may need a Td booster every 10 years.  Varicella vaccine. You may need this if you have not been vaccinated.  Zoster vaccine. You may need this after age 42.  Measles, mumps, and rubella (MMR) vaccine. You may need at least one dose of MMR if you were born in 1957 or later. You may also need a second dose.  Pneumococcal 13-valent conjugate (PCV13) vaccine. One dose is recommended after age 56.  Pneumococcal polysaccharide (PPSV23) vaccine. One dose is recommended after age 47.  Meningococcal vaccine. You may need this if you have certain conditions.  Hepatitis A vaccine.  You may need this if you have certain conditions or if you travel or work in places where you may be exposed to hepatitis A.  Hepatitis B vaccine. You may need this if you have certain conditions or if you travel or work in places where you may be exposed to hepatitis B.  Haemophilus influenzae type b (Hib) vaccine. You may need this if you have certain conditions. Talk to your health care provider about which screenings and vaccines you need and how often you need them. This information is not intended to replace advice given to you by your health care provider. Make sure you discuss any questions you have with your health care provider. Document Released: 02/14/2015 Document Revised: 03/10/2017 Document Reviewed: 11/19/2014 Elsevier Interactive Patient Education  2019 [employer].

## 2018-06-29 NOTE — Progress Notes (Signed)
Subjective:   Joann Peterson is a 73 y.o. female who presents for Medicare Annual (Subsequent) preventive examination. I connected with patient by a telephone and verified that I am speaking with the correct person using two identifiers. Patient stated full name and DOB. Patient gave permission to continue with telephonic visit. Patient's location was at home and Nurse's location was at [place] office.   Review of Systems:  No ROS.  Medicare Wellness Virtual Visit.  Visual/audio telehealth visit, UTA vital signs.   See social history for additional risk factors.    Sleep patterns: has interrupted sleep, gets up 3-4 times nightly to void and sleeps 6-8 hours nightly.    Home Safety/Smoke Alarms: Feels safe in home. Smoke alarms in place.  Living environment; residence and Firearm Safety: 1-story house/ trailer. Lives with family, no needs for DME, good support system Seat Belt Safety/Bike Helmet: Wears seat belt.      Objective:     Vitals: There were no vitals taken for this visit.  There is no height or weight on file to calculate BMI.  Advanced Directives 06/29/2018 08/09/2014 04/29/2014  Does Patient Have a Medical Advance Directive? Yes Yes Yes  Type of Advance Directive - - Living will;Healthcare Power of [place] in Chart? - Yes -    Tobacco Social History   Tobacco Use  Smoking Status Former Smoker  . Packs/day: 1.50  . Years: 30.00  . Pack years: 45.00  . Types: Cigarettes  . Last attempt to quit: 11/01/2008  . Years since quitting: 9.6  Smokeless Tobacco Never Used  Tobacco Comment   Married, lives with spouse and g-son (raising him). 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 ALLERGIC RHINITIS   . Allergy   . B12 nutritional deficiency dx 07/2010   . Cancer ([place])    skin cancer   . GERD (gastroesophageal reflux disease)   . HYPERLIPIDEMIA   . OA (osteoarthritis)    Left thumb, hand and knees  . OSTEOPOROSIS    DEXA 01/2013: -2.2, on Prolia   Past Surgical History:  Procedure Laterality Date  . COLONOSCOPY  03-25-2011  . DENTAL SURGERY N/A 07/25/2014   had 2 upper right; one upper left; 1 lower left and 2 lower right with grafts and implants to come  . NASAL SINUS SURGERY    . POLYPECTOMY  03-25-2011   Family History  Problem Relation Age of Onset  . Arthritis Mother   . Hyperlipidemia Mother   . Hypertension Mother   . Heart disease Mother   . Arthritis Father   . Hyperlipidemia Father   . Lung cancer Father   . Prostate cancer Father   . Colon polyps Father   . Colon cancer Paternal Grandfather   . Colon cancer Paternal Uncle    Social History   Socioeconomic History  . Marital status: Married    Spouse name: Not on file  . Number of children: Not on file  . Years of education: Not on file  . Highest education level: Not on file  Occupational History  . Not on file  Social Needs  . Financial resource strain: Not hard at all  . Food insecurity:    Worry: Never true    Inability: Never true  . Transportation needs:  Medical: No    Non-medical: No  Tobacco Use  . Smoking status: Former Smoker    Packs/day: 1.50    Years: 30.00    Pack years: 45.00    Types: Cigarettes    Last attempt to quit: 11/01/2008    Years since quitting: 9.6  . Smokeless tobacco: Never Used  . Tobacco comment: Married, lives with spouse and g-son (raising him). 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 Alcohol use: Yes    Alcohol/week: 0.0 standard drinks    Comment: Rarely  . Drug use: No  . Sexual activity: Not Currently  Lifestyle  . Physical activity:    Days per week: 0 days    Minutes per session: 0 min  . Stress: Not at all  Relationships  . Social connections:    Talks on phone:  More than three times a week    Gets together: More than three times a week    Attends religious service: 1 to 4 times per year    Active member of club or organization: Yes    Attends meetings of clubs or organizations: 1 to 4 times per year    Relationship status: Married  Other Topics Concern  . Not on file  Social History Narrative  . Not on file    Outpatient Encounter Medications as of 06/29/2018  Medication Sig  . ALPRAZolam (NIRAVAM) 0.5 MG dissolvable tablet Take 1 tablet (0.5 mg total) by mouth 2 (two) times daily as needed for anxiety.  . Calcium Citrate-Vitamin D (CITRACAL MAXIMUM PO) Take by mouth.  . cetirizine (ZYRTEC) 10 MG tablet TAKE 1 TABLET BY MOUTH EVERY DAY  . Magnesium 250 MG TABS Take by mouth.  . Multiple Vitamins-Minerals (MULTIVITAMIN ADULTS 50+ PO) Take by mouth.  Marland Kitchen omeprazole (PRILOSEC) 20 MG capsule Take 20 mg by mouth daily.  . polyethylene glycol powder (GLYCOLAX/MIRALAX) powder Take 17 g by mouth daily.  . simvastatin (ZOCOR) 40 MG tablet Take 1 tablet (40 mg total) by mouth at bedtime.  . [DISCONTINUED] simvastatin (ZOCOR) 40 MG tablet Take 1 tablet (40 mg total) by mouth at bedtime.  Marland Kitchen levocetirizine (XYZAL) 5 MG tablet Take 1 tablet (5 mg total) by mouth every evening.   No facility-administered encounter medications on file as of 06/29/2018.     Activities of Daily Living No flowsheet data found.  Patient Care Team: Hoyt Koch, MD as PCP - General (Internal Medicine) Lomax, Marny Lowenstein, MD (Inactive) as Consulting Physician (Obstetrics and Gynecology) Hilarie Fredrickson, Lajuan Lines, MD (Gastroenterology) Key, Nelia Shi, NP as Nurse Practitioner (Gynecology)    Assessment:   This is a routine wellness examination for [facility]. Physical assessment deferred to PCP.  Exercise Activities and Dietary recommendations   Diet (meal preparation, eat out, water intake, caffeinated beverages, dairy products, fruits and vegetables): in general, a "healthy" diet      Reviewed heart healthy and diabetic diet. Encouraged patient to increase daily water and healthy fluid intake. Discussed weight loss tips and diet. Relevant patient education assigned to patient using Emmi.  Goals    . Patient Stated     Lose weight by increasing exercise    . Weight < 136 lb (61.689 kg)     Goal is 136;  Thinking about going to [employer] center; States she has friends who have been successful Referred to [email or web address]        Fall Risk Fall Risk  06/29/2018 06/28/2017 01/06/2017 10/03/2015 08/09/2014  Falls in the past year? 0 No No No No  Comment - - Emmi Telephone Survey: data to providers prior to load Emmi Telephone Survey: data to providers prior to load -  Number falls in past yr: 0 - - - -    Depression Screen PHQ 2/9 Scores 06/29/2018 06/28/2017 08/09/2014 02/05/2013  PHQ - 2 Score 0 1 0 0     Cognitive Function       Ad8 score reviewed for issues:  Issues making decisions: no  Less interest in hobbies / activities: no  Repeats questions, stories (family complaining): no  Trouble using ordinary gadgets (microwave, computer, phone):no  Forgets the month or year: no  Mismanaging finances: no  Remembering appts: no  Daily problems with thinking and/or memory: no Ad8 score is= 0    Immunization History  Administered Date(s) Administered  . Influenza Split 01/12/2011, 10/15/2011  . Influenza, High Dose Seasonal PF 12/21/2012, 10/29/2014, 10/03/2015, 10/18/2016, 10/06/2017  . Influenza,inj,Quad PF,6+ Mos 10/18/2013  . Pneumococcal Conjugate-13 11/28/2014  . Pneumococcal Polysaccharide-23 07/31/2012  . Td 02/02/2003  . Tdap 08/06/2013  . Zoster 02/08/2011   Screening Tests Health Maintenance  Topic Date Due  . Hepatitis C Screening  06-01-1945  . INFLUENZA VACCINE  09/02/2018  . COLONOSCOPY  05/15/2019  . MAMMOGRAM  12/06/2019  . TETANUS/TDAP  08/07/2023  . DEXA SCAN  Completed  . PNA vac Low Risk Adult  Completed      Plan:      I have personally reviewed and noted the following in the patient's chart:   . Medical and social history . Use of alcohol, tobacco or illicit drugs  . Current medications and supplements . Functional ability and status . Nutritional status . Physical activity . Advanced directives . List of other physicians . Screenings to include cognitive, depression, and falls . Referrals and appointments  In addition, I have reviewed and discussed with patient certain preventive protocols, quality metrics, and best practice recommendations. A written personalized care plan for preventive services as well as general preventive health recommendations were provided to patient.     Michiel Cowboy, RN  06/29/2018

## 2018-06-30 ENCOUNTER — Ambulatory Visit: Payer: Medicare Other

## 2018-07-04 ENCOUNTER — Ambulatory Visit: Payer: Medicare Other

## 2018-07-14 ENCOUNTER — Ambulatory Visit: Payer: Medicare Other

## 2018-08-25 ENCOUNTER — Ambulatory Visit: Payer: Medicare Other | Admitting: Internal Medicine

## 2018-09-01 ENCOUNTER — Encounter: Payer: Self-pay | Admitting: Internal Medicine

## 2018-09-01 ENCOUNTER — Ambulatory Visit (INDEPENDENT_AMBULATORY_CARE_PROVIDER_SITE_OTHER): Payer: Medicare Other | Admitting: Internal Medicine

## 2018-09-01 ENCOUNTER — Other Ambulatory Visit: Payer: Self-pay

## 2018-09-01 ENCOUNTER — Other Ambulatory Visit (INDEPENDENT_AMBULATORY_CARE_PROVIDER_SITE_OTHER): Payer: Medicare Other

## 2018-09-01 VITALS — BP 120/82 | HR 81 | Temp 97.9°F | Ht 61.5 in | Wt 160.0 lb

## 2018-09-01 DIAGNOSIS — R351 Nocturia: Secondary | ICD-10-CM | POA: Insufficient documentation

## 2018-09-01 DIAGNOSIS — M25461 Effusion, right knee: Secondary | ICD-10-CM | POA: Diagnosis not present

## 2018-09-01 DIAGNOSIS — M25561 Pain in right knee: Secondary | ICD-10-CM

## 2018-09-01 DIAGNOSIS — S8001XA Contusion of right knee, initial encounter: Secondary | ICD-10-CM

## 2018-09-01 DIAGNOSIS — E782 Mixed hyperlipidemia: Secondary | ICD-10-CM

## 2018-09-01 DIAGNOSIS — E538 Deficiency of other specified B group vitamins: Secondary | ICD-10-CM | POA: Diagnosis not present

## 2018-09-01 LAB — VITAMIN B12: Vitamin B-12: 888 pg/mL (ref 211–911)

## 2018-09-01 LAB — LIPID PANEL
Cholesterol: 174 mg/dL (ref 0–200)
HDL: 38.7 mg/dL — ABNORMAL LOW (ref >39.00)
NonHDL: 135.35
Total CHOL/HDL Ratio: 4
Triglycerides: 287 mg/dL — ABNORMAL HIGH (ref 0.0–149.0)
VLDL: 57.4 mg/dL — ABNORMAL HIGH (ref 0.0–40.0)

## 2018-09-01 LAB — COMPREHENSIVE METABOLIC PANEL
ALT: 34 U/L (ref 0–35)
AST: 43 U/L — ABNORMAL HIGH (ref 0–37)
Albumin: 4.3 g/dL (ref 3.5–5.2)
Alkaline Phosphatase: 123 U/L — ABNORMAL HIGH (ref 39–117)
BUN: 14 mg/dL (ref 6–23)
CO2: 25 mEq/L (ref 19–32)
Calcium: 9.7 mg/dL (ref 8.4–10.5)
Chloride: 105 mEq/L (ref 96–112)
Creatinine, Ser: 0.99 mg/dL (ref 0.40–1.20)
GFR: 54.94 mL/min — ABNORMAL LOW (ref >60.00)
Glucose, Bld: 100 mg/dL — ABNORMAL HIGH (ref 70–99)
Potassium: 4.1 mEq/L (ref 3.5–5.1)
Sodium: 140 mEq/L (ref 135–145)
Total Bilirubin: 0.7 mg/dL (ref 0.2–1.2)
Total Protein: 7.1 g/dL (ref 6.0–8.3)

## 2018-09-01 LAB — CBC
HCT: 47.7 % — ABNORMAL HIGH (ref 36.0–46.0)
Hemoglobin: 16.3 g/dL — ABNORMAL HIGH (ref 12.0–15.0)
MCHC: 34.2 g/dL (ref 30.0–36.0)
MCV: 96.5 fl (ref 78.0–100.0)
Platelets: 215 10*3/uL (ref 150.0–400.0)
RBC: 4.94 Mil/uL (ref 3.87–5.11)
RDW: 13 % (ref 11.5–15.5)
WBC: 6.9 10*3/uL (ref 4.0–10.5)

## 2018-09-01 LAB — LDL CHOLESTEROL, DIRECT: Direct LDL: 101 mg/dL

## 2018-09-01 MED ORDER — CYANOCOBALAMIN 1000 MCG/ML IJ SOLN
1000.0000 ug | Freq: Once | INTRAMUSCULAR | Status: AC
  Administered 2018-09-01: 1000 ug via INTRAMUSCULAR

## 2018-09-01 NOTE — Patient Instructions (Signed)
We have drained that pocket by the knee and you can just leave the bandage.

## 2018-09-01 NOTE — Progress Notes (Signed)
   Subjective:   Patient ID: Joann Peterson, female    DOB: [birth year], 73 y.o.   MRN: [id number]  HPI The patient is a 73 YO female coming in for concerns about B12 deficiency (taking monthly injections, has missed since pandemic, getting some tingling in feet and is concerned about low levels, has been taking oral instead but she feels it is not working well), and urinary frequency (nocturnally, she does not want meds, wants to talk about other options, she is waking up 3-4 times at night to urinate, denies burning or urgency or pain, tries not to drink a lot at night time but sometimes does, she is able to get right back to bed, denies missing sleep due to this), and recent fall with right knee pain (she fell about weeks ago, this started right afterwards, denies bruising at that time or since, denies swelling in the knee, denies twist injury of the knee, did ice this for some time afterwards, this is not going away, some mild pain with pressing, not affecting her walking, she wants to get drained if possible).   Review of Systems  Constitutional: Negative.   HENT: Negative.   Eyes: Negative.   Respiratory: Negative for cough, chest tightness and shortness of breath.   Cardiovascular: Negative for chest pain, palpitations and leg swelling.  Gastrointestinal: Negative for abdominal distention, abdominal pain, constipation, diarrhea, nausea and vomiting.  Genitourinary: Positive for enuresis and frequency. Negative for decreased urine volume, difficulty urinating, dysuria, flank pain, hematuria and urgency.  Musculoskeletal: Positive for arthralgias and myalgias.  Skin: Negative.        Swelling near right knee  Neurological: Positive for numbness.  Psychiatric/Behavioral: Negative.     Objective:  Physical Exam Constitutional:      Appearance: She is well-developed.  HENT:     Head: Normocephalic and atraumatic.  Neck:     Musculoskeletal: Normal range of motion.  Cardiovascular:     Rate and Rhythm: Normal rate and regular rhythm.  Pulmonary:     Effort: Pulmonary effort is normal. No respiratory distress.     Breath sounds: Normal breath sounds. No wheezing or rales.  Abdominal:     General: Bowel sounds are normal. There is no distension.     Palpations: Abdomen is soft.     Tenderness: There is no abdominal tenderness. There is no right CVA tenderness, left CVA tenderness or rebound.  Musculoskeletal:        General: Tenderness present.     Comments: Fluctuant about 2 cm oval lesion superior to the knee cap, see procedure note for drainage  Skin:    General: Skin is warm and dry.  Neurological:     Mental Status: She is alert and oriented to person, place, and time.     Coordination: Coordination normal.     Vitals:   09/01/18 0849  BP: 120/82  Pulse: 81  Temp: 97.9 F (36.6 C)  TempSrc: Oral  SpO2: 95%  Weight: 160 lb (72.6 kg)  Height: 5' 1.5" (1.562 m)   See procedure note also  Assessment & Plan:  B12 given at visit

## 2018-09-01 NOTE — Assessment & Plan Note (Signed)
Suspect related to aging. No signs of symptoms of UTI. Encouraged kegel exercises and limiting fluid intake after 7-8PM. She does not want to take medication for this problem as she falls back asleep easily.

## 2018-09-01 NOTE — Assessment & Plan Note (Addendum)
I and D done for suspected hematoma given the fluctuant nature of this and 4 cc removed which almost completed eliminated the hematoma. Advised that the remainder can take months to fully flatten and resolve. Ice for pain if needed or tylenol. Does not appear infectious so antibiotics were not prescribed.

## 2018-09-01 NOTE — Assessment & Plan Note (Signed)
Given B12 injection today after she went to lab to get B12 levels checked to see if oral replacement is adequate.

## 2018-09-01 NOTE — Progress Notes (Signed)
Patient ID: Joann Peterson, female   DOB: 07-04-1945, 73 y.o.   MRN: [id number] Incision and Drainage Procedure Note  Pre-operative Diagnosis: hematoma  Post-operative Diagnosis: same  Indications: pain  Anesthesia: ethyl chloride spray  Procedure Details  The procedure, risks and complications have been discussed in detail (including, but not limited to airway compromise, infection, bleeding) with the patient, and the patient has verbal consent to the procedure.  The skin was sterilely prepped and draped over the affected area in the usual fashion. After adequate local anesthesia, I&D with a #23 gauge needle was performed on the right knee. Serosanguinous fluid was present and drained.The patient was observed until stable.  Findings: Serosanguinous fluid  EBL: 4 cc's  Drains: none  Condition: Tolerated procedure well and Stable   Complications: none.

## 2018-09-08 ENCOUNTER — Encounter: Payer: Self-pay | Admitting: Internal Medicine

## 2018-09-08 ENCOUNTER — Ambulatory Visit (INDEPENDENT_AMBULATORY_CARE_PROVIDER_SITE_OTHER): Payer: Medicare Other | Admitting: Internal Medicine

## 2018-09-08 ENCOUNTER — Other Ambulatory Visit: Payer: Self-pay

## 2018-09-08 VITALS — BP 120/80 | HR 81 | Temp 97.5°F | Ht 61.5 in | Wt 160.0 lb

## 2018-09-08 DIAGNOSIS — M25561 Pain in right knee: Secondary | ICD-10-CM

## 2018-09-08 DIAGNOSIS — S8001XA Contusion of right knee, initial encounter: Secondary | ICD-10-CM

## 2018-09-08 MED ORDER — FLUTICASONE PROPIONATE 50 MCG/ACT NA SUSP: 2.0000 | Freq: Every day | NASAL | 3 refills | Status: DC

## 2018-09-08 NOTE — Progress Notes (Signed)
Patient ID: Joann Peterson, female   DOB: 04-06-1945, 73 y.o.   MRN: [id number] Incision and Drainage Procedure Note  Pre-operative Diagnosis: hematoma  Post-operative Diagnosis: same  Indications: pain, swelling  Anesthesia: ethyl chloride spray  Procedure Details  The procedure, risks and complications have been discussed in detail (including, but not limited to airway compromise, infection, bleeding) with the patient, and the patient has verbal consent prior to the procedure.  The skin was sterilely prepped and draped over the affected area in the usual fashion. After adequate local anesthesia, I&D with a # 21 needle was performed on the right, inferior kneecap. Purulent drainage: absent, fluid was grossly bloody The patient was observed until stable.  Findings: Serosanguinous fluid  EBL: 4 cc's  Drains: none  Condition: Tolerated procedure well and Stable   Complications: none.

## 2018-09-08 NOTE — Assessment & Plan Note (Signed)
I and D performed for hematoma with 4 cc fluid removed during procedure. Advised to ice, ace wrap to help prevent re-accumulation.

## 2018-09-08 NOTE — Patient Instructions (Signed)
Use ice on the knee and ace wrap to help avoid re-swelling.

## 2018-09-08 NOTE — Progress Notes (Signed)
   Subjective:   Patient ID: Joann Peterson, female    DOB: 02-Mar-1945, 73 y.o.   MRN: [id number]  HPI The patient is a 73 YO female coming in for right knee swelling and pain. She previously had this drained about 8 days ago with finding of serosanguinous fluid. This was after a fall with direct trauma to the knee. She denies further falls. She had increase in size over the last week or so. Still not having significant pain but hurts when she touches the area. Denies persistent bleeding after drainage, rash on skin, fevers or chills.   Review of Systems  Constitutional: Negative.   HENT: Negative.   Eyes: Negative.   Respiratory: Negative for cough, chest tightness and shortness of breath.   Cardiovascular: Negative for chest pain, palpitations and leg swelling.  Gastrointestinal: Negative for abdominal distention, abdominal pain, constipation, diarrhea, nausea and vomiting.  Musculoskeletal: Positive for myalgias.  Skin: Negative.   Neurological: Negative.   Psychiatric/Behavioral: Negative.     Objective:  Physical Exam Constitutional:      Appearance: She is well-developed.  HENT:     Head: Normocephalic and atraumatic.  Neck:     Musculoskeletal: Normal range of motion.  Cardiovascular:     Rate and Rhythm: Normal rate and regular rhythm.  Pulmonary:     Effort: Pulmonary effort is normal. No respiratory distress.     Breath sounds: Normal breath sounds. No wheezing or rales.  Abdominal:     General: Bowel sounds are normal. There is no distension.     Palpations: Abdomen is soft.     Tenderness: There is no abdominal tenderness. There is no rebound.  Musculoskeletal:     Comments: Lesion inferior right kneecap with swelling and some fluctuance, pain on palpation, about 1-2 cm circular, see procedure note, after I and D lesion is flat and no residual bleeding, bandage in place  Skin:    General: Skin is warm and dry.  Neurological:     Mental Status: She is alert and  oriented to person, place, and time.     Coordination: Coordination normal.     Vitals:   09/08/18 1256  BP: 120/80  Pulse: 81  Temp: (!) 97.5 F (36.4 C)  TempSrc: Oral  SpO2: 98%  Weight: 160 lb (72.6 kg)  Height: 5' 1.5" (1.562 m)    Assessment & Plan:

## 2018-10-16 DIAGNOSIS — H43393 Other vitreous opacities, bilateral: Secondary | ICD-10-CM | POA: Diagnosis not present

## 2018-10-16 DIAGNOSIS — H2513 Age-related nuclear cataract, bilateral: Secondary | ICD-10-CM | POA: Diagnosis not present

## 2018-10-16 DIAGNOSIS — H25043 Posterior subcapsular polar age-related cataract, bilateral: Secondary | ICD-10-CM | POA: Diagnosis not present

## 2018-10-16 DIAGNOSIS — H25013 Cortical age-related cataract, bilateral: Secondary | ICD-10-CM | POA: Diagnosis not present

## 2018-10-16 DIAGNOSIS — H2512 Age-related nuclear cataract, left eye: Secondary | ICD-10-CM | POA: Diagnosis not present

## 2018-11-25 DIAGNOSIS — Z23 Encounter for immunization: Secondary | ICD-10-CM | POA: Diagnosis not present

## 2018-11-29 ENCOUNTER — Other Ambulatory Visit: Payer: Self-pay | Admitting: Gynecology

## 2018-11-29 DIAGNOSIS — Z1231 Encounter for screening mammogram for malignant neoplasm of breast: Secondary | ICD-10-CM

## 2018-12-12 DIAGNOSIS — H2512 Age-related nuclear cataract, left eye: Secondary | ICD-10-CM | POA: Diagnosis not present

## 2018-12-12 DIAGNOSIS — H25812 Combined forms of age-related cataract, left eye: Secondary | ICD-10-CM | POA: Diagnosis not present

## 2018-12-27 DIAGNOSIS — H25041 Posterior subcapsular polar age-related cataract, right eye: Secondary | ICD-10-CM | POA: Diagnosis not present

## 2018-12-27 DIAGNOSIS — H2511 Age-related nuclear cataract, right eye: Secondary | ICD-10-CM | POA: Diagnosis not present

## 2018-12-27 DIAGNOSIS — H25011 Cortical age-related cataract, right eye: Secondary | ICD-10-CM | POA: Diagnosis not present

## 2019-01-09 DIAGNOSIS — H2511 Age-related nuclear cataract, right eye: Secondary | ICD-10-CM | POA: Diagnosis not present

## 2019-01-09 DIAGNOSIS — H25011 Cortical age-related cataract, right eye: Secondary | ICD-10-CM | POA: Diagnosis not present

## 2019-01-09 DIAGNOSIS — H25811 Combined forms of age-related cataract, right eye: Secondary | ICD-10-CM | POA: Diagnosis not present

## 2019-01-09 DIAGNOSIS — H25041 Posterior subcapsular polar age-related cataract, right eye: Secondary | ICD-10-CM | POA: Diagnosis not present

## 2019-01-22 ENCOUNTER — Other Ambulatory Visit: Payer: Self-pay

## 2019-01-22 ENCOUNTER — Ambulatory Visit
Admission: RE | Admit: 2019-01-22 | Discharge: 2019-01-22 | Disposition: A | Discharge status: Home / Self Care | Payer: Medicare Other | Source: Ambulatory Visit | Attending: Gynecology | Admitting: Gynecology

## 2019-01-22 DIAGNOSIS — Z1231 Encounter for screening mammogram for malignant neoplasm of breast: Secondary | ICD-10-CM

## 2019-03-01 ENCOUNTER — Ambulatory Visit: Payer: Medicare Other

## 2019-03-09 ENCOUNTER — Ambulatory Visit: Payer: Medicare Other | Attending: Internal Medicine

## 2019-03-09 DIAGNOSIS — Z23 Encounter for immunization: Secondary | ICD-10-CM

## 2019-03-09 NOTE — Progress Notes (Signed)
   Covid-19 Vaccination Clinic  Name:  Joann Peterson    MRN: [id number] DOB: November 20, 1945  03/09/2019  [name] was observed post Covid-19 immunization for 15 minutes without incidence. She was provided with Vaccine Information Sheet and instruction to access the V-Safe system.   [name] was instructed to call 911 with any severe reactions post vaccine: Marland Kitchen Difficulty breathing  . Swelling of your face and throat  . A fast heartbeat  . A bad rash all over your body  . Dizziness and weakness    Immunizations Administered    Name Date Dose VIS Date Route   Pfizer COVID-19 Vaccine 03/09/2019  4:40 PM 0.3 mL 01/12/2019 Intramuscular   Manufacturer: [place]   Lot: [id number]   [place]: [id number]

## 2019-03-12 ENCOUNTER — Ambulatory Visit: Payer: Medicare Other

## 2019-04-04 ENCOUNTER — Ambulatory Visit: Payer: Medicare Other | Attending: Internal Medicine

## 2019-04-04 DIAGNOSIS — Z23 Encounter for immunization: Secondary | ICD-10-CM | POA: Insufficient documentation

## 2019-04-04 NOTE — Progress Notes (Signed)
   Covid-19 Vaccination Clinic  Name:  Joann Peterson    MRN: [id number] DOB: [birth year]  04/04/2019  [name] was observed post Covid-19 immunization for 15 minutes without incident. She was provided with Vaccine Information Sheet and instruction to access the V-Safe system.   [name] was instructed to call 911 with any severe reactions post vaccine: Marland Kitchen Difficulty breathing  . Swelling of face and throat  . A fast heartbeat  . A bad rash all over body  . Dizziness and weakness   Immunizations Administered    Name Date Dose VIS Date Route   Pfizer COVID-19 Vaccine 04/04/2019  9:09 AM 0.3 mL 01/12/2019 Intramuscular   Manufacturer: [place]   Lot: [id number]   [place]: [id number]

## 2019-07-25 ENCOUNTER — Telehealth: Payer: Self-pay | Admitting: Internal Medicine

## 2019-07-25 MED ORDER — SIMVASTATIN 40 MG PO TABS: 40.0000 mg | ORAL_TABLET | Freq: Every day | ORAL | 0 refills | Status: DC

## 2019-07-25 NOTE — Telephone Encounter (Signed)
New message:   1.Medication Requested: simvastatin (ZOCOR) 40 MG tablet 2. Pharmacy (Name, [facility], [place]): [facility] [place] 3. On Med List: Yes  4. Last Visit with PCP: 09/08/18  5. Next visit date with PCP: None   Agent: Please be advised that RX refills may take up to 3 business days. We ask that you follow-up with your pharmacy.

## 2019-08-27 DIAGNOSIS — H16223 Keratoconjunctivitis sicca, not specified as Sjogren's, bilateral: Secondary | ICD-10-CM | POA: Diagnosis not present

## 2019-08-27 DIAGNOSIS — H04123 Dry eye syndrome of bilateral lacrimal glands: Secondary | ICD-10-CM | POA: Diagnosis not present

## 2019-08-27 DIAGNOSIS — H43813 Vitreous degeneration, bilateral: Secondary | ICD-10-CM | POA: Diagnosis not present

## 2019-08-27 DIAGNOSIS — H26493 Other secondary cataract, bilateral: Secondary | ICD-10-CM | POA: Diagnosis not present

## 2019-10-24 ENCOUNTER — Encounter: Payer: Self-pay | Admitting: Family

## 2019-10-24 ENCOUNTER — Other Ambulatory Visit: Payer: Self-pay

## 2019-10-24 ENCOUNTER — Ambulatory Visit (INDEPENDENT_AMBULATORY_CARE_PROVIDER_SITE_OTHER): Payer: Medicare Other | Admitting: Family

## 2019-10-24 VITALS — BP 148/70 | HR 81 | Temp 98.0°F | Ht 61.5 in | Wt 163.0 lb

## 2019-10-24 DIAGNOSIS — E559 Vitamin D deficiency, unspecified: Secondary | ICD-10-CM | POA: Diagnosis not present

## 2019-10-24 DIAGNOSIS — E782 Mixed hyperlipidemia: Secondary | ICD-10-CM

## 2019-10-24 DIAGNOSIS — Z23 Encounter for immunization: Secondary | ICD-10-CM

## 2019-10-24 DIAGNOSIS — R5383 Other fatigue: Secondary | ICD-10-CM

## 2019-10-24 MED ORDER — SERTRALINE HCL 50 MG PO TABS: 50.0000 mg | ORAL_TABLET | Freq: Every day | ORAL | 1 refills | Status: DC

## 2019-10-24 MED ORDER — SERTRALINE HCL 50 MG PO TABS: ORAL_TABLET | ORAL | 3 refills | Status: DC

## 2019-10-24 MED ORDER — SIMVASTATIN 40 MG PO TABS: 40.0000 mg | ORAL_TABLET | Freq: Every day | ORAL | 0 refills | Status: DC

## 2019-10-24 NOTE — Progress Notes (Signed)
Joann Peterson is a 74 y.o. female with the following history as recorded in EpicCare:  Patient Active Problem List   Diagnosis Date Noted  . Nocturia 09/01/2018  . Right knee pain 09/01/2018  . Adjustment disorder with mixed anxiety and depressed mood 06/28/2017  . Routine general medical examination at a health care facility 02/10/2016  . Fatigue 08/09/2014  . B12 deficiency 07/21/2010  . Hyperlipidemia 10/24/2009  . ARTHRITIS 10/24/2009  . Osteoporosis 10/24/2009    Current Outpatient Medications  Medication Sig Dispense Refill  . ALPRAZolam (NIRAVAM) 0.5 MG dissolvable tablet Take 1 tablet (0.5 mg total) by mouth 2 (two) times daily as needed for anxiety. 40 tablet 0  . Calcium Citrate-Vitamin D (CITRACAL MAXIMUM PO) Take by mouth.    Marland Kitchen omeprazole (PRILOSEC) 20 MG capsule Take 20 mg by mouth daily.    . polyethylene glycol powder (GLYCOLAX/MIRALAX) powder Take 17 g by mouth daily. 3350 g 1  . simvastatin (ZOCOR) 40 MG tablet Take 1 tablet (40 mg total) by mouth at bedtime. APPOINTMENT NEEDED FOR ADDITIONAL REFILLS 90 tablet 0  . polyethylene glycol (MIRALAX) 17 g packet Miralax    . sertraline (ZOLOFT) 50 MG tablet Take 1 tablet (50 mg total) by mouth daily. Take 1/2 tablet po qd x 1 week; then increase to daily as directed 90 tablet 1   No current facility-administered medications for this visit.    Allergies: Penicillins  Past Medical History:  Diagnosis Date  . ALLERGIC RHINITIS   . Allergy   . B12 nutritional deficiency dx 07/2010  . Cancer ([place])    skin cancer   . GERD (gastroesophageal reflux disease)   . HYPERLIPIDEMIA   . OA (osteoarthritis)    Left thumb, hand and knees  . OSTEOPOROSIS    DEXA 01/2013: -2.2, on Prolia    Past Surgical History:  Procedure Laterality Date  . COLONOSCOPY  03-25-2011  . DENTAL SURGERY N/A 07/25/2014   had 2 upper right; one upper left; 1 lower left and 2 lower right with grafts and implants to come  . NASAL SINUS SURGERY    .  POLYPECTOMY  03-25-2011    Family History  Problem Relation Age of Onset  . Arthritis Mother   . Hyperlipidemia Mother   . Hypertension Mother   . Heart disease Mother   . Arthritis Father   . Hyperlipidemia Father   . Lung cancer Father   . Prostate cancer Father   . Colon polyps Father   . Colon cancer Paternal Grandfather   . Colon cancer Paternal Uncle     Social History   Tobacco Use  . Smoking status: Former Smoker    Packs/day: 1.50    Years: 30.00    Pack years: 45.00    Types: Cigarettes    Quit date: 11/01/2008    Years since quitting: 10.9  . Smokeless tobacco: Never Used  . Tobacco comment: Married, lives with spouse and g-son (raising him). 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  Neurologic: Alert and oriented; speech intact; face symmetrical; moves all extremities well; CNII-XII intact without focal deficit   Assessment:  1. Other fatigue   2. Mixed hyperlipidemia   3. Vitamin D deficiency     Plan:  1. Suspect underling situational anxiety/ depression; will give trial of Zoloft- use as directed; follow-up with her PCP in  December as scheduled; she will get labs in anticipation of that appointment. 2. Refill updated; 3. Check Vitamin D level;  This visit occurred during the SARS-CoV-2 public health emergency.  Safety protocols were in place, including screening questions prior to the visit, additional usage of staff PPE, and extensive cleaning of exam room while observing appropriate contact time as indicated for disinfecting solutions.     No follow-ups on file.  Orders Placed This Encounter  Procedures  . CBC w/Diff    Standing Status:   Future    Standing Expiration Date:   10/23/2020  . Comp Met (CMET)    Standing Status:   Future    Standing Expiration Date:   10/23/2020  . Lipid panel    Standing Status:   Future    Standing Expiration Date:   10/23/2020  . TSH    Standing Status:   Future    Standing Expiration Date:   10/23/2020  . Vitamin D (25 hydroxy)    Standing Status:   Future    Standing Expiration Date:   10/23/2020    Requested Prescriptions   Signed Prescriptions Disp Refills  . simvastatin (ZOCOR) 40 MG tablet 90 tablet 0    Sig: Take 1 tablet (40 mg total) by mouth at bedtime. APPOINTMENT NEEDED FOR ADDITIONAL REFILLS  . sertraline (ZOLOFT) 50 MG tablet 90 tablet 1    Sig: Take 1 tablet (50 mg total) by mouth daily. Take 1/2 tablet po qd x 1 week; then increase to daily as directed

## 2019-11-05 DIAGNOSIS — Z23 Encounter for immunization: Secondary | ICD-10-CM | POA: Diagnosis not present

## 2019-11-29 ENCOUNTER — Other Ambulatory Visit: Payer: Self-pay | Admitting: Gynecology

## 2019-11-29 DIAGNOSIS — Z1231 Encounter for screening mammogram for malignant neoplasm of breast: Secondary | ICD-10-CM

## 2019-12-31 ENCOUNTER — Other Ambulatory Visit (INDEPENDENT_AMBULATORY_CARE_PROVIDER_SITE_OTHER): Payer: Medicare Other

## 2019-12-31 DIAGNOSIS — E559 Vitamin D deficiency, unspecified: Secondary | ICD-10-CM | POA: Diagnosis not present

## 2019-12-31 DIAGNOSIS — E782 Mixed hyperlipidemia: Secondary | ICD-10-CM

## 2019-12-31 DIAGNOSIS — R5383 Other fatigue: Secondary | ICD-10-CM

## 2019-12-31 LAB — LIPID PANEL
Cholesterol: 186 mg/dL (ref 0–200)
HDL: 40.3 mg/dL (ref >39.00)
NonHDL: 145.38
Total CHOL/HDL Ratio: 5
Triglycerides: 236 mg/dL — ABNORMAL HIGH (ref 0.0–149.0)
VLDL: 47.2 mg/dL — ABNORMAL HIGH (ref 0.0–40.0)

## 2019-12-31 LAB — CBC WITH DIFFERENTIAL/PLATELET
Basophils Absolute: 0.1 10*3/uL (ref 0.0–0.1)
Basophils Relative: 1.1 % (ref 0.0–3.0)
Eosinophils Absolute: 0.2 10*3/uL (ref 0.0–0.7)
Eosinophils Relative: 3.1 % (ref 0.0–5.0)
HCT: 48.8 % — ABNORMAL HIGH (ref 36.0–46.0)
Hemoglobin: 16.6 g/dL — ABNORMAL HIGH (ref 12.0–15.0)
Lymphocytes Relative: 37.6 % (ref 12.0–46.0)
Lymphs Abs: 2.6 10*3/uL (ref 0.7–4.0)
MCHC: 34.1 g/dL (ref 30.0–36.0)
MCV: 96.8 fl (ref 78.0–100.0)
Monocytes Absolute: 0.7 10*3/uL (ref 0.1–1.0)
Monocytes Relative: 10.3 % (ref 3.0–12.0)
Neutro Abs: 3.3 10*3/uL (ref 1.4–7.7)
Neutrophils Relative %: 47.9 % (ref 43.0–77.0)
Platelets: 218 10*3/uL (ref 150.0–400.0)
RBC: 5.04 Mil/uL (ref 3.87–5.11)
RDW: 12.8 % (ref 11.5–15.5)
WBC: 6.8 10*3/uL (ref 4.0–10.5)

## 2019-12-31 LAB — COMPREHENSIVE METABOLIC PANEL
ALT: 42 U/L — ABNORMAL HIGH (ref 0–35)
AST: 58 U/L — ABNORMAL HIGH (ref 0–37)
Albumin: 4.5 g/dL (ref 3.5–5.2)
Alkaline Phosphatase: 123 U/L — ABNORMAL HIGH (ref 39–117)
BUN: 14 mg/dL (ref 6–23)
CO2: 27 mEq/L (ref 19–32)
Calcium: 9.9 mg/dL (ref 8.4–10.5)
Chloride: 103 mEq/L (ref 96–112)
Creatinine, Ser: 1.06 mg/dL (ref 0.40–1.20)
GFR: 51.72 mL/min — ABNORMAL LOW (ref >60.00)
Glucose, Bld: 105 mg/dL — ABNORMAL HIGH (ref 70–99)
Potassium: 4.3 mEq/L (ref 3.5–5.1)
Sodium: 140 mEq/L (ref 135–145)
Total Bilirubin: 0.8 mg/dL (ref 0.2–1.2)
Total Protein: 7.6 g/dL (ref 6.0–8.3)

## 2019-12-31 LAB — LDL CHOLESTEROL, DIRECT: Direct LDL: 114 mg/dL

## 2019-12-31 LAB — TSH: TSH: 3.31 u[IU]/mL (ref 0.35–4.50)

## 2019-12-31 LAB — VITAMIN D 25 HYDROXY (VIT D DEFICIENCY, FRACTURES): VITD: 88.03 ng/mL (ref 30.00–100.00)

## 2019-12-31 NOTE — Addendum Note (Signed)
Addended by: Jacob Moores on: 12/31/2019 08:08 AM   Modules accepted: Orders

## 2020-01-03 ENCOUNTER — Encounter: Payer: Self-pay | Admitting: Internal Medicine

## 2020-01-03 ENCOUNTER — Other Ambulatory Visit: Payer: Self-pay

## 2020-01-03 ENCOUNTER — Ambulatory Visit (INDEPENDENT_AMBULATORY_CARE_PROVIDER_SITE_OTHER): Payer: Medicare Other | Admitting: Internal Medicine

## 2020-01-03 VITALS — BP 120/74 | HR 67 | Temp 98.2°F | Ht 61.5 in | Wt 158.0 lb

## 2020-01-03 DIAGNOSIS — Z0001 Encounter for general adult medical examination with abnormal findings: Secondary | ICD-10-CM

## 2020-01-03 DIAGNOSIS — E782 Mixed hyperlipidemia: Secondary | ICD-10-CM | POA: Diagnosis not present

## 2020-01-03 DIAGNOSIS — F4323 Adjustment disorder with mixed anxiety and depressed mood: Secondary | ICD-10-CM | POA: Diagnosis not present

## 2020-01-03 DIAGNOSIS — Z0000 Encounter for general adult medical examination without abnormal findings: Secondary | ICD-10-CM

## 2020-01-03 DIAGNOSIS — E538 Deficiency of other specified B group vitamins: Secondary | ICD-10-CM

## 2020-01-03 MED ORDER — IPRATROPIUM BROMIDE 0.03 % NA SOLN: 2.0000 | Freq: Two times a day (BID) | NASAL | 12 refills | Status: DC

## 2020-01-03 MED ORDER — SERTRALINE HCL 100 MG PO TABS
100.0000 mg | ORAL_TABLET | Freq: Every day | ORAL | 1 refills | Status: DC
Start: 2020-01-03 — End: 2020-04-01

## 2020-01-03 MED ORDER — SIMVASTATIN 40 MG PO TABS
40.0000 mg | ORAL_TABLET | Freq: Every day | ORAL | 3 refills | Status: DC
Start: 2020-01-03 — End: 2021-08-03

## 2020-01-03 NOTE — Patient Instructions (Signed)
Health Maintenance, Female Adopting a healthy lifestyle and getting preventive care are important in promoting health and wellness. Ask your health care provider about:  The right schedule for you to have regular tests and exams.  Things you can do on your own to prevent diseases and keep yourself healthy. What should I know about diet, weight, and exercise? Eat a healthy diet   Eat a diet that includes plenty of vegetables, fruits, low-fat dairy products, and lean protein.  Do not eat a lot of foods that are high in solid fats, added sugars, or sodium. Maintain a healthy weight Body mass index (BMI) is used to identify weight problems. It estimates body fat based on height and weight. Your health care provider can help determine your BMI and help you achieve or maintain a healthy weight. Get regular exercise Get regular exercise. This is one of the most important things you can do for your health. Most adults should:  Exercise for at least 150 minutes each week. The exercise should increase your heart rate and make you sweat (moderate-intensity exercise).  Do strengthening exercises at least twice a week. This is in addition to the moderate-intensity exercise.  Spend less time sitting. Even light physical activity can be beneficial. Watch cholesterol and blood lipids Have your blood tested for lipids and cholesterol at 74 years of age, then have this test every 5 years. Have your cholesterol levels checked more often if:  Your lipid or cholesterol levels are high.  You are older than 74 years of age.  You are at high risk for heart disease. What should I know about cancer screening? Depending on your health history and family history, you may need to have cancer screening at various ages. This may include screening for:  Breast cancer.  Cervical cancer.  Colorectal cancer.  Skin cancer.  Lung cancer. What should I know about heart disease, diabetes, and high blood  pressure? Blood pressure and heart disease  High blood pressure causes heart disease and increases the risk of stroke. This is more likely to develop in people who have high blood pressure readings, are of African descent, or are overweight.  Have your blood pressure checked: ? Every 3-5 years if you are 18-39 years of age. ? Every year if you are 40 years old or older. Diabetes Have regular diabetes screenings. This checks your fasting blood sugar level. Have the screening done:  Once every three years after age 40 if you are at a normal weight and have a low risk for diabetes.  More often and at a younger age if you are overweight or have a high risk for diabetes. What should I know about preventing infection? Hepatitis B If you have a higher risk for hepatitis B, you should be screened for this virus. Talk with your health care provider to find out if you are at risk for hepatitis B infection. Hepatitis C Testing is recommended for:  Everyone born from 1945 through 1965.  Anyone with known risk factors for hepatitis C. Sexually transmitted infections (STIs)  Get screened for STIs, including gonorrhea and chlamydia, if: ? You are sexually active and are younger than 74 years of age. ? You are older than 74 years of age and your health care provider tells you that you are at risk for this type of infection. ? Your sexual activity has changed since you were last screened, and you are at increased risk for chlamydia or gonorrhea. Ask your health care provider if   you are at risk.  Ask your health care provider about whether you are at high risk for HIV. Your health care provider may recommend a prescription medicine to help prevent HIV infection. If you choose to take medicine to prevent HIV, you should first get tested for HIV. You should then be tested every 3 months for as long as you are taking the medicine. Pregnancy  If you are about to stop having your period (premenopausal) and  you may become pregnant, seek counseling before you get pregnant.  Take 400 to 800 micrograms (mcg) of folic acid every day if you become pregnant.  Ask for birth control (contraception) if you want to prevent pregnancy. Osteoporosis and menopause Osteoporosis is a disease in which the bones lose minerals and strength with aging. This can result in bone fractures. If you are 65 years old or older, or if you are at risk for osteoporosis and fractures, ask your health care provider if you should:  Be screened for bone loss.  Take a calcium or vitamin D supplement to lower your risk of fractures.  Be given hormone replacement therapy (HRT) to treat symptoms of menopause. Follow these instructions at home: Lifestyle  Do not use any products that contain nicotine or tobacco, such as cigarettes, e-cigarettes, and chewing tobacco. If you need help quitting, ask your health care provider.  Do not use street drugs.  Do not share needles.  Ask your health care provider for help if you need support or information about quitting drugs. Alcohol use  Do not drink alcohol if: ? Your health care provider tells you not to drink. ? You are pregnant, may be pregnant, or are planning to become pregnant.  If you drink alcohol: ? Limit how much you use to 0-1 drink a day. ? Limit intake if you are breastfeeding.  Be aware of how much alcohol is in your drink. In the [place] one drink equals one 12 oz bottle of beer (355 mL), one 5 oz glass of wine (148 mL), or one 1 oz glass of hard liquor (44 mL). General instructions  Schedule regular health, dental, and eye exams.  Stay current with your vaccines.  Tell your health care provider if: ? You often feel depressed. ? You have ever been abused or do not feel safe at home. Summary  Adopting a healthy lifestyle and getting preventive care are important in promoting health and wellness.  Follow your health care provider's instructions about healthy  diet, exercising, and getting tested or screened for diseases.  Follow your health care provider's instructions on monitoring your cholesterol and blood pressure. This information is not intended to replace advice given to you by your health care provider. Make sure you discuss any questions you have with your health care provider. Document Revised: 01/11/2018 Document Reviewed: 01/11/2018 Elsevier Patient Education  2020 Elsevier Inc.  

## 2020-01-03 NOTE — Progress Notes (Signed)
Subjective:   Patient ID: Joann Peterson, female    DOB: November 06, 1945, 74 y.o.   MRN: [id number]  HPI Here for medicare wellness, no new complaints. Please see A/P for status and treatment of chronic medical problems.   HPI #2: Here for follow up anxiety (uses alprazolam rarely, just started zoloft 50 mg daily and this is helping but not enough, she would like to increase dose if possible), and cholesterol (taking simvastatin daily, denies missing doses, denies chest pains or stroke symptoms), and B12 deficiency (taking oral replacement, denies numbness or tingling, some mild fatigue she is not sure if this is mood related).   Diet: heart healthy Physical activity: sedentary Depression/mood screen: negative Hearing: intact to whispered voice Visual acuity: grossly normal, performs annual eye exam  ADLs: capable Fall risk: none Home safety: good Cognitive evaluation: intact to orientation, naming, recall and repetition EOL planning: adv directives discussed    Office Visit from 10/24/2019 in [place] at [facility] Total Score 2        Office Visit from 10/24/2019 in [place] at [facility]  PHQ-9 Total Score 5      I have personally reviewed and have noted 1. The patient's medical and social history - reviewed today no changes 2. Their use of alcohol, tobacco or illicit drugs 3. Their current medications and supplements 4. The patient's functional ability including ADL's, fall risks, home safety risks and hearing or visual impairment. 5. Diet and physical activities 6. Evidence for depression or mood disorders 7. Care team reviewed and updated  Patient Care Team: Hoyt Koch, MD as PCP - General (Internal Medicine) Lomax, Marny Lowenstein, MD (Inactive) as Consulting Physician (Obstetrics and Gynecology) Pyrtle, Lajuan Lines, MD (Gastroenterology) Key, Nelia Shi, NP as Nurse Practitioner (Gynecology) Past Medical History:  Diagnosis Date  .  ALLERGIC RHINITIS   . Allergy   . B12 nutritional deficiency dx 07/2010  . Cancer ([place])    skin cancer   . GERD (gastroesophageal reflux disease)   . HYPERLIPIDEMIA   . OA (osteoarthritis)    Left thumb, hand and knees  . OSTEOPOROSIS    DEXA 01/2013: -2.2, on Prolia   Past Surgical History:  Procedure Laterality Date  . COLONOSCOPY  03-25-2011  . DENTAL SURGERY N/A 07/25/2014   had 2 upper right; one upper left; 1 lower left and 2 lower right with grafts and implants to come  . NASAL SINUS SURGERY    . POLYPECTOMY  03-25-2011   Family History  Problem Relation Age of Onset  . Arthritis Mother   . Hyperlipidemia Mother   . Hypertension Mother   . Heart disease Mother   . Arthritis Father   . Hyperlipidemia Father   . Lung cancer Father   . Prostate cancer Father   . Colon polyps Father   . Colon cancer Paternal Grandfather   . Colon cancer Paternal Uncle      Review of Systems  Constitutional: Negative.   HENT: Negative.   Eyes: Negative.   Respiratory: Negative for cough, chest tightness and shortness of breath.   Cardiovascular: Negative for chest pain, palpitations and leg swelling.  Gastrointestinal: Negative for abdominal distention, abdominal pain, constipation, diarrhea, nausea and vomiting.  Musculoskeletal: Negative.   Skin: Negative.   Neurological: Negative.   Psychiatric/Behavioral: Positive for dysphoric mood.    Objective:  Physical Exam Constitutional:      Appearance: She is well-developed.  HENT:     Head: Normocephalic  and atraumatic.  Cardiovascular:     Rate and Rhythm: Normal rate and regular rhythm.  Pulmonary:     Effort: Pulmonary effort is normal. No respiratory distress.     Breath sounds: Normal breath sounds. No wheezing or rales.  Abdominal:     General: Bowel sounds are normal. There is no distension.     Palpations: Abdomen is soft.     Tenderness: There is no abdominal tenderness. There is no rebound.  Musculoskeletal:      Cervical back: Normal range of motion.  Skin:    General: Skin is warm and dry.  Neurological:     Mental Status: She is alert and oriented to person, place, and time.     Coordination: Coordination normal.     Vitals:   01/03/20 0935  BP: 120/74  Pulse: 67  Temp: 98.2 F (36.8 C)  TempSrc: Oral  SpO2: 96%  Weight: 158 lb (71.7 kg)  Height: 5' 1.5" (1.562 m)    This visit occurred during the SARS-CoV-2 public health emergency.  Safety protocols were in place, including screening questions prior to the visit, additional usage of staff PPE, and extensive cleaning of exam room while observing appropriate contact time as indicated for disinfecting solutions.   Assessment & Plan:

## 2020-01-04 NOTE — Assessment & Plan Note (Signed)
Flu shot complete, covid-19 up to date including booster. Pneumonia complete. Shingrix counseled. Tetanus due 2025. Colonoscopy due declines today but will get within 1 year. Mammogram scheduled for Dec 2021, pap smear aged out and dexa due declines. Counseled about sun safety and mole surveillance. Counseled about the dangers of distracted driving. Given 10 year screening recommendations.

## 2020-01-04 NOTE — Assessment & Plan Note (Signed)
Taking oral replacement and advised this should be for life or multivitamin with this in it.

## 2020-01-04 NOTE — Assessment & Plan Note (Signed)
Increase zoloft to 100 mg daily from 50 mg daily and call or return in 3-4 weeks for adjustment.

## 2020-01-04 NOTE — Assessment & Plan Note (Signed)
Refilled simvastatin, reviewed recent lipid panel with her during visit and discussed CV risk as reason for consistent usage.

## 2020-01-30 ENCOUNTER — Ambulatory Visit: Payer: Medicare Other

## 2020-02-17 ENCOUNTER — Other Ambulatory Visit: Payer: Self-pay | Admitting: Family

## 2020-03-31 ENCOUNTER — Telehealth: Payer: Self-pay | Admitting: Internal Medicine

## 2020-03-31 NOTE — Telephone Encounter (Signed)
1.Medication Requested: sertraline (ZOLOFT) 100 MG tablet    2. Pharmacy (Name, Street, [place]): [place], [place]  3. On Med List: yes   4. Last Visit with PCP: 12.2.21  5. Next visit date with PCP: n/a    Agent: Please be advised that RX refills may take up to 3 business days. We ask that you follow-up with your pharmacy.

## 2020-03-31 NOTE — Telephone Encounter (Signed)
Ok to refill? Please advise.  

## 2020-04-01 MED ORDER — SERTRALINE HCL 100 MG PO TABS
100.0000 mg | ORAL_TABLET | Freq: Every day | ORAL | 1 refills | Status: DC
Start: 2020-04-01 — End: 2021-08-03

## 2020-04-01 NOTE — Telephone Encounter (Signed)
Ok to refill 

## 2020-04-01 NOTE — Addendum Note (Signed)
Addended by: Thomes Cake on: 04/01/2020 11:12 AM   Modules accepted: Orders

## 2020-04-01 NOTE — Telephone Encounter (Signed)
Refill has been sent to the patient's pharmacy.  

## 2020-04-10 ENCOUNTER — Ambulatory Visit
Admission: RE | Admit: 2020-04-10 | Discharge: 2020-04-10 | Disposition: A | Discharge status: Home / Self Care | Payer: Medicare Other | Source: Ambulatory Visit | Attending: Gynecology | Admitting: Gynecology

## 2020-04-10 ENCOUNTER — Other Ambulatory Visit: Payer: Self-pay

## 2020-04-10 DIAGNOSIS — Z1231 Encounter for screening mammogram for malignant neoplasm of breast: Secondary | ICD-10-CM | POA: Diagnosis not present

## 2020-09-09 DIAGNOSIS — L255 Unspecified contact dermatitis due to plants, except food: Secondary | ICD-10-CM | POA: Diagnosis not present

## 2020-10-13 DIAGNOSIS — H04123 Dry eye syndrome of bilateral lacrimal glands: Secondary | ICD-10-CM | POA: Diagnosis not present

## 2020-10-13 DIAGNOSIS — H43813 Vitreous degeneration, bilateral: Secondary | ICD-10-CM | POA: Diagnosis not present

## 2020-10-13 DIAGNOSIS — H43393 Other vitreous opacities, bilateral: Secondary | ICD-10-CM | POA: Diagnosis not present

## 2020-10-13 DIAGNOSIS — H26493 Other secondary cataract, bilateral: Secondary | ICD-10-CM | POA: Diagnosis not present

## 2020-11-15 DIAGNOSIS — Z23 Encounter for immunization: Secondary | ICD-10-CM | POA: Diagnosis not present

## 2020-11-17 DIAGNOSIS — Z23 Encounter for immunization: Secondary | ICD-10-CM | POA: Diagnosis not present

## 2020-12-30 DIAGNOSIS — Z01419 Encounter for gynecological examination (general) (routine) without abnormal findings: Secondary | ICD-10-CM | POA: Diagnosis not present

## 2021-01-28 ENCOUNTER — Telehealth: Payer: Self-pay | Admitting: Internal Medicine

## 2021-01-28 NOTE — Telephone Encounter (Signed)
N/A unable to leave a message for patient to call back to schedule Medicare Annual Wellness Visit   Last AWV  01/03/20  Please schedule at anytime with [facility] if patient calls the office back.    40 Minutes appointment   Any questions, please call me at [phone]

## 2021-02-17 ENCOUNTER — Telehealth: Payer: Self-pay | Admitting: Internal Medicine

## 2021-02-17 NOTE — Telephone Encounter (Signed)
Connected to [facility] 1.16.2023.  Caller states her sister has a nose bleed. Started as little nose bleed and now it is really bad, ongoing.  Advised to see PCP within 2 weeks. Appt scheduled.

## 2021-02-17 NOTE — Telephone Encounter (Signed)
Noted  

## 2021-02-20 ENCOUNTER — Ambulatory Visit: Payer: Medicare Other | Admitting: Internal Medicine

## 2021-03-02 ENCOUNTER — Other Ambulatory Visit: Payer: Self-pay | Admitting: Gynecology

## 2021-03-02 DIAGNOSIS — Z1231 Encounter for screening mammogram for malignant neoplasm of breast: Secondary | ICD-10-CM

## 2021-04-13 ENCOUNTER — Other Ambulatory Visit: Payer: Self-pay

## 2021-04-13 ENCOUNTER — Ambulatory Visit
Admission: RE | Admit: 2021-04-13 | Discharge: 2021-04-13 | Disposition: A | Discharge status: Home / Self Care | Payer: Medicare Other | Source: Ambulatory Visit | Attending: Gynecology | Admitting: Gynecology

## 2021-04-13 DIAGNOSIS — Z1231 Encounter for screening mammogram for malignant neoplasm of breast: Secondary | ICD-10-CM | POA: Diagnosis not present

## 2021-05-11 ENCOUNTER — Other Ambulatory Visit: Payer: Self-pay

## 2021-05-11 ENCOUNTER — Emergency Department (HOSPITAL_BASED_OUTPATIENT_CLINIC_OR_DEPARTMENT_OTHER)
Admission: EM | Admit: 2021-05-11 | Discharge: 2021-05-11 | Disposition: A | Discharge status: Home / Self Care | Payer: Medicare Other | Attending: Emergency Medicine | Admitting: Emergency Medicine

## 2021-05-11 ENCOUNTER — Encounter (HOSPITAL_BASED_OUTPATIENT_CLINIC_OR_DEPARTMENT_OTHER): Payer: Self-pay

## 2021-05-11 DIAGNOSIS — R040 Epistaxis: Secondary | ICD-10-CM | POA: Insufficient documentation

## 2021-05-11 DIAGNOSIS — R0981 Nasal congestion: Secondary | ICD-10-CM | POA: Diagnosis not present

## 2021-05-11 DIAGNOSIS — J329 Chronic sinusitis, unspecified: Secondary | ICD-10-CM | POA: Insufficient documentation

## 2021-05-11 DIAGNOSIS — Z87891 Personal history of nicotine dependence: Secondary | ICD-10-CM | POA: Insufficient documentation

## 2021-05-11 DIAGNOSIS — T7840XA Allergy, unspecified, initial encounter: Secondary | ICD-10-CM | POA: Insufficient documentation

## 2021-05-11 LAB — CBC WITH DIFFERENTIAL/PLATELET
Abs Immature Granulocytes: 0.01 10*3/uL (ref 0.00–0.07)
Basophils Absolute: 0 10*3/uL (ref 0.0–0.1)
Basophils Relative: 1 %
Eosinophils Absolute: 0.1 10*3/uL (ref 0.0–0.5)
Eosinophils Relative: 1 %
HCT: 52.6 % — ABNORMAL HIGH (ref 36.0–46.0)
Hemoglobin: 17.6 g/dL — ABNORMAL HIGH (ref 12.0–15.0)
Immature Granulocytes: 0 %
Lymphocytes Relative: 38 %
Lymphs Abs: 2.3 10*3/uL (ref 0.7–4.0)
MCH: 32.2 pg (ref 26.0–34.0)
MCHC: 33.5 g/dL (ref 30.0–36.0)
MCV: 96.3 fL (ref 80.0–100.0)
Monocytes Absolute: 0.5 10*3/uL (ref 0.1–1.0)
Monocytes Relative: 8 %
Neutro Abs: 3.1 10*3/uL (ref 1.7–7.7)
Neutrophils Relative %: 52 %
Platelets: 216 10*3/uL (ref 150–400)
RBC: 5.46 MIL/uL — ABNORMAL HIGH (ref 3.87–5.11)
RDW: 13.3 % (ref 11.5–15.5)
WBC: 6 10*3/uL (ref 4.0–10.5)
nRBC: 0 % (ref 0.0–0.2)

## 2021-05-11 LAB — BASIC METABOLIC PANEL
Anion gap: 8 (ref 5–15)
BUN: 12 mg/dL (ref 8–23)
CO2: 28 mmol/L (ref 22–32)
Calcium: 9.6 mg/dL (ref 8.9–10.3)
Chloride: 106 mmol/L (ref 98–111)
Creatinine, Ser: 0.74 mg/dL (ref 0.44–1.00)
GFR, Estimated: >60 mL/min (ref >60)
Glucose, Bld: 91 mg/dL (ref 70–99)
Potassium: 4 mmol/L (ref 3.5–5.1)
Sodium: 142 mmol/L (ref 135–145)

## 2021-05-11 LAB — PROTIME-INR
INR: 0.9 (ref 0.8–1.2)
Prothrombin Time: 12.1 seconds (ref 11.4–15.2)

## 2021-05-11 MED ORDER — OXYMETAZOLINE HCL 0.05 % NA SOLN
1.0000 | Freq: Once | NASAL | Status: AC
  Administered 2021-05-11: 1 via NASAL
  Filled 2021-05-11: qty 30

## 2021-05-11 NOTE — Discharge Instructions (Signed)
It was a pleasure caring for you today in the emergency department. ? ?Please consider using a humidifier in your bedroom.  Consider applying topical ointment to your nares.  Use antihistamine to help with your seasonal allergies ? ? ?Please return to the emergency department for any worsening or worrisome symptoms. ? ?

## 2021-05-11 NOTE — ED Triage Notes (Signed)
Pt presents with intermittent nose bleed since Saturday. Pt was supposed to have a F/U with her ENT on Friday for the same. Pt reports her last episode was approx 3 months ago. Which is why she canceled her f/u visit on Friday. Pt has had 3 episodes since Saturday. Pt reports feeling tired and light headed afterwards  ?

## 2021-05-11 NOTE — ED Provider Notes (Signed)
?[facility] EMERGENCY DEPT ?Provider Note ? ? ?CSN: [id number] ?Arrival date & time: 05/11/21  1016 ? ?  ? ?History ? ?Chief Complaint  ?Patient presents with  ? Epistaxis  ? ? ?Joann Peterson is a 76 y.o. female. ? ? Patient as above with significant medical history as below, including allergic rhinitis, GERD, hyperlipidemia, OA who presents to the ED with complaint of epistaxis ? ?Patient with history of recurrent nosebleeds, going on for greater than 1 year.  Patient had appointment with ENT on Friday but canceled because she had not had a nosebleed in the past month.  Over the weekend she has had 2 nosebleeds.  Last nosebleed was earlier today.  No thinners.  No associated trauma.  Nosebleed relieved with direct pressure.  Lasted approximately 30 to 45 minutes. ? ?Patient does report chronic allergies, sinusitis, no significant treatment following OTC antihistamines.  ? ?No trauma, no headaches.  No syncope.  No nausea, vomiting, chest pain or dyspnea. ? ? ? ?Past Medical History: ?No date: ALLERGIC RHINITIS ?No date: Allergy ?dx 07/2010: B12 nutritional deficiency ?No date: Cancer [facility]) ?    Comment:  skin cancer  ?No date: GERD (gastroesophageal reflux disease) ?No date: HYPERLIPIDEMIA ?No date: OA (osteoarthritis) ?    Comment:  Left thumb, hand and knees ?No date: OSTEOPOROSIS ?    Comment:  DEXA 01/2013: -2.2, on Prolia ? ?Past Surgical History: ?03-25-2011: COLONOSCOPY ?07/25/2014: DENTAL SURGERY; N/A ?    Comment:  had 2 upper right; one upper left; 1 lower left and 2  ?             lower right with grafts and implants to come ?No date: NASAL SINUS SURGERY ?03-25-2011: POLYPECTOMY  ? ? ?The history is provided by the patient. No language interpreter was used.  ?Epistaxis ?Location:  R nare ?Associated symptoms: congestion   ?Associated symptoms: no cough, no fever and no headaches   ? ?  ? ?Home Medications ?Prior to Admission medications   ?Medication Sig Start Date End Date Taking?  Authorizing Provider  ?ALPRAZolam (NIRAVAM) 0.5 MG dissolvable tablet Take 1 tablet (0.5 mg total) by mouth 2 (two) times daily as needed for anxiety. 06/28/17   Hoyt Koch, MD  ?Calcium Citrate-Vitamin D (CITRACAL MAXIMUM PO) Take by mouth.    [provider]  ?ipratropium (ATROVENT) 0.03 % nasal spray Place 2 sprays into both nostrils every 12 (twelve) hours. 01/03/20   Hoyt Koch, MD  ?omeprazole (PRILOSEC) 20 MG capsule Take 20 mg by mouth daily.    [provider]  ?polyethylene glycol (MIRALAX) 17 g packet Miralax    [provider]  ?polyethylene glycol powder (GLYCOLAX/MIRALAX) powder Take 17 g by mouth daily. 02/05/13   Rowe Clack, MD  ?sertraline (ZOLOFT) 100 MG tablet Take 1 tablet (100 mg total) by mouth daily. 04/01/20   Hoyt Koch, MD  ?simvastatin (ZOCOR) 40 MG tablet Take 1 tablet (40 mg total) by mouth at bedtime. 01/03/20   Hoyt Koch, MD  ?   ? ?Allergies    ?Penicillins   ? ?Review of Systems   ?Review of Systems  ?Constitutional:  Negative for chills and fever.  ?HENT:  Positive for congestion and nosebleeds. Negative for facial swelling and trouble swallowing.   ?Eyes:  Negative for photophobia and visual disturbance.  ?Respiratory:  Negative for cough and shortness of breath.   ?Cardiovascular:  Negative for chest pain and palpitations.  ?Gastrointestinal:  Negative for abdominal  pain, nausea and vomiting.  ?Endocrine: Negative for polydipsia and polyuria.  ?Genitourinary:  Negative for difficulty urinating and hematuria.  ?Musculoskeletal:  Negative for gait problem and joint swelling.  ?Skin:  Negative for pallor and rash.  ?Neurological:  Negative for syncope and headaches.  ?Psychiatric/Behavioral:  Negative for agitation and confusion.   ? ?Physical Exam ?Updated Vital Signs ?BP 140/76 (BP Location: Left Arm)   Pulse 71   Temp 97.8 ?F (36.6 ?C)   Resp 18   Ht 5' 1.5" (1.562 m)   Wt 57.6 kg   SpO2 97%   BMI  23.61 kg/m?  ?Physical Exam ?Vitals and nursing note reviewed.  ?Constitutional:   ?   General: She is not in acute distress. ?   Appearance: Normal appearance. She is not ill-appearing, toxic-appearing or diaphoretic.  ?HENT:  ?   Head: Normocephalic and atraumatic. No raccoon eyes, Battle's sign, right periorbital erythema or left periorbital erythema.  ?   Right Ear: External ear normal.  ?   Left Ear: External ear normal.  ?   Nose: Nose normal.  ?   Comments: Dried blood to right nare ?No obvious ongoing bleeding. ?No blood in posterior oropharynx ?   Mouth/Throat:  ?   Mouth: Mucous membranes are moist.  ?Eyes:  ?   General: No scleral icterus.    ?   Right eye: No discharge.     ?   Left eye: No discharge.  ?Cardiovascular:  ?   Rate and Rhythm: Normal rate and regular rhythm.  ?   Pulses: Normal pulses.  ?   Heart sounds: Normal heart sounds.  ?Pulmonary:  ?   Effort: Pulmonary effort is normal. No respiratory distress.  ?   Breath sounds: Normal breath sounds.  ?Abdominal:  ?   General: Abdomen is flat.  ?   Tenderness: There is no abdominal tenderness.  ?Musculoskeletal:     ?   General: Normal range of motion.  ?   Cervical back: Normal range of motion.  ?   Right lower leg: No edema.  ?   Left lower leg: No edema.  ?Skin: ?   General: Skin is warm and dry.  ?   Capillary Refill: Capillary refill takes less than 2 seconds.  ?   Coloration: Skin is not pale.  ?Neurological:  ?   Mental Status: She is alert.  ?Psychiatric:     ?   Mood and Affect: Mood normal.     ?   Behavior: Behavior normal.  ? ? ?ED Results / Procedures / Treatments   ?Labs ?(all labs ordered are listed, but only abnormal results are displayed) ?Labs Reviewed  ?CBC WITH DIFFERENTIAL/PLATELET - Abnormal; Notable for the following components:  ?    Result Value  ? RBC 5.46 (*)   ? Hemoglobin 17.6 (*)   ? HCT 52.6 (*)   ? All other components within normal limits  ?BASIC METABOLIC PANEL  ?PROTIME-INR  ? ? ?EKG ?None ? ?Radiology ?No  results found. ? ?Procedures ?Procedures  ? ? ?Medications Ordered in ED ?Medications  ?oxymetazoline (AFRIN) 0.05 % nasal spray 1 spray (1 spray Each Nare Given 05/11/21 1240)  ? ? ?ED Course/ Medical Decision Making/ A&P ?  ?                        ?Medical Decision Making ?Amount and/or Complexity of Data Reviewed ?Labs: ordered. ? ?Risk ?OTC drugs. ? ? ?Initial Impression  and Ddx ?This patient presents to the Emergency Department for the above complaint. This involves an extensive number of treatment options and is a complaint that carries with it a high risk of complications and morbidity. Vital signs were reviewed.  ? ?Serious etiologies considered. Ddx includes but is not limited to: Anterior nosebleed, posterior nosebleed ? ?Patient PMH that increases complexity of ED encounter: Recurrent nosebleeds ? ?Social determinants of health include   ?Social Determinants of Health with Concerns  ? ?Tobacco Use: Medium Risk  ? Smoking Tobacco Use: Former  ? Smokeless Tobacco Use: Never  ? Passive Exposure: Not on file  ?Financial Resource Strain: Not on file  ?Food Insecurity: Not on file  ?Transportation Needs: Not on file  ?Physical Activity: Not on file  ?Stress: Not on file  ?Social Connections: Not on file  ?Intimate Partner Violence: Not on file  ?Depression (PHQ2-9): Not on file  ?Alcohol Screen: Not on file  ?Housing: Not on file  ?  ?Social Connections: Not on file  ?   ? ?Previous records obtained and reviewed  ? ?Interpretation of Diagnostics ?Labs & imaging results that were available during my care of the patient were visualized by me and considered in my medical decision making. ? ?PDMP reviewed  ? ?Labs reviewed, concern for mild hemoconcentration ?hemoglobin 17.6, similar to her baseline ?Labs stable ?She is HDS ?No thinners ? ? ?Patient Reassessment and Ultimate Disposition/Management ? ? ?  ? ? ?Provided Afrin ?No bleeding vessels visualized on exam of naris ?Low suspicion for posterior  nosebleed ?Discussed nosebleed precautions and care at home. ?Advise follow-up with ENT ? ?The patient improved significantly and was discharged in stable condition. Detailed discussions were had with the patient regarding current finding

## 2021-05-18 DIAGNOSIS — R040 Epistaxis: Secondary | ICD-10-CM | POA: Diagnosis not present

## 2021-08-03 ENCOUNTER — Ambulatory Visit (INDEPENDENT_AMBULATORY_CARE_PROVIDER_SITE_OTHER): Payer: Medicare Other | Admitting: Internal Medicine

## 2021-08-03 ENCOUNTER — Encounter: Payer: Self-pay | Admitting: Internal Medicine

## 2021-08-03 VITALS — BP 118/76 | HR 72 | Resp 18 | Ht 61.5 in | Wt 130.6 lb

## 2021-08-03 DIAGNOSIS — Z86010 Personal history of colonic polyps: Secondary | ICD-10-CM | POA: Diagnosis not present

## 2021-08-03 DIAGNOSIS — E782 Mixed hyperlipidemia: Secondary | ICD-10-CM

## 2021-08-03 DIAGNOSIS — Z0000 Encounter for general adult medical examination without abnormal findings: Secondary | ICD-10-CM | POA: Diagnosis not present

## 2021-08-03 DIAGNOSIS — H9193 Unspecified hearing loss, bilateral: Secondary | ICD-10-CM

## 2021-08-03 DIAGNOSIS — E538 Deficiency of other specified B group vitamins: Secondary | ICD-10-CM | POA: Diagnosis not present

## 2021-08-03 DIAGNOSIS — R5383 Other fatigue: Secondary | ICD-10-CM

## 2021-08-03 DIAGNOSIS — R351 Nocturia: Secondary | ICD-10-CM

## 2021-08-03 LAB — LIPID PANEL
Cholesterol: 269 mg/dL — ABNORMAL HIGH (ref 0–200)
HDL: 41 mg/dL (ref >39.00)
NonHDL: 228.33
Total CHOL/HDL Ratio: 7
Triglycerides: 274 mg/dL — ABNORMAL HIGH (ref 0.0–149.0)
VLDL: 54.8 mg/dL — ABNORMAL HIGH (ref 0.0–40.0)

## 2021-08-03 LAB — LDL CHOLESTEROL, DIRECT: Direct LDL: 187 mg/dL

## 2021-08-03 LAB — VITAMIN B12: Vitamin B-12: 375 pg/mL (ref 211–911)

## 2021-08-03 MED ORDER — OXYBUTYNIN CHLORIDE ER 10 MG PO TB24: 10.0000 mg | ORAL_TABLET | Freq: Every day | ORAL | 3 refills | Status: AC

## 2021-08-03 NOTE — Progress Notes (Signed)
Subjective:   Patient ID: Joann Peterson, female    DOB: 08/04/1945, 76 y.o.   MRN: [id number]  HPI Here for medicare wellness, with new complaints. Please see A/P for status and treatment of chronic medical problems.   Diet: heart healthy Physical activity: sedentary, active Depression/mood screen: negative Hearing: moderate loss, refer to audiology Visual acuity: grossly normal, s/p cataract, performs annual eye exam  ADLs: capable Fall risk: none Home safety: good Cognitive evaluation: intact to orientation, naming, recall and repetition EOL planning: adv directives discussed, in place  [employer] Visit from 08/03/2021 in [place] at [facility]  PHQ-2 Total Score 3       [place] Office Visit from 08/03/2021 in [place] at [facility]  PHQ-9 Total Score 7         06/28/2017    9:19 AM 06/29/2018   12:29 PM 10/24/2019    9:25 AM 05/11/2021   10:51 AM 08/03/2021    8:52 AM  Leonore in the past year? No 0 0  0  Was there an injury with Fall?   0  0  Fall Risk Category Calculator   0  0  Fall Risk Category   Low  Low  Patient Fall Risk Level   Low fall risk Low fall risk Low fall risk    I have personally reviewed and have noted 1. The patient's medical and social history - reviewed today no changes 2. Their use of alcohol, tobacco or illicit drugs 3. Their current medications and supplements 4. The patient's functional ability including ADL's, fall risks, home safety risks and hearing or visual impairment. 5. Diet and physical activities 6. Evidence for depression or mood disorders 7. Care team reviewed and updated 8.  The patient is not on an opioid pain medication.  Patient Care Team: Hoyt Koch, MD as PCP - General (Internal Medicine) Lomax, Marny Lowenstein, MD (Inactive) as Consulting Physician (Obstetrics and Gynecology) Jerene Bears, MD (Gastroenterology) Key, Nelia Shi, NP as Nurse Practitioner  (Gynecology) Past Medical History:  Diagnosis Date   ALLERGIC RHINITIS    Allergy    B12 nutritional deficiency dx 07/2010   Cancer [facility])    skin cancer    GERD (gastroesophageal reflux disease)    HYPERLIPIDEMIA    OA (osteoarthritis)    Left thumb, hand and knees   OSTEOPOROSIS    DEXA 01/2013: -2.2, on Prolia   Past Surgical History:  Procedure Laterality Date   COLONOSCOPY  03-25-2011   DENTAL SURGERY N/A 07/25/2014   had 2 upper right; one upper left; 1 lower left and 2 lower right with grafts and implants to come   NASAL SINUS SURGERY     POLYPECTOMY  03-25-2011   Family History  Problem Relation Age of Onset   Arthritis Mother    Hyperlipidemia Mother    Hypertension Mother    Heart disease Mother    Arthritis Father    Hyperlipidemia Father    Lung cancer Father    Prostate cancer Father    Colon polyps Father    Colon cancer Paternal Grandfather    Colon cancer Paternal Uncle    Review of Systems  Constitutional:  Positive for fatigue.  HENT:  Positive for hearing loss.   Eyes: Negative.   Respiratory:  Negative for cough, chest tightness and shortness of breath.   Cardiovascular:  Negative for chest pain, palpitations and leg swelling.  Gastrointestinal:  Negative for abdominal  distention, abdominal pain, constipation, diarrhea, nausea and vomiting.  Musculoskeletal: Negative.   Skin: Negative.   Neurological: Negative.   Psychiatric/Behavioral: Negative.      Objective:  Physical Exam Constitutional:      Appearance: She is well-developed.  HENT:     Head: Normocephalic and atraumatic.  Cardiovascular:     Rate and Rhythm: Normal rate and regular rhythm.  Pulmonary:     Effort: Pulmonary effort is normal. No respiratory distress.     Breath sounds: Normal breath sounds. No wheezing or rales.  Abdominal:     General: Bowel sounds are normal. There is no distension.     Palpations: Abdomen is soft.     Tenderness: There is no abdominal tenderness.  There is no rebound.  Musculoskeletal:     Cervical back: Normal range of motion.  Skin:    General: Skin is warm and dry.  Neurological:     Mental Status: She is alert and oriented to person, place, and time.     Coordination: Coordination normal.     Vitals:   08/03/21 0844  BP: 118/76  Pulse: 72  Resp: 18  SpO2: 97%  Weight: 130 lb 9.6 oz (59.2 kg)  Height: 5' 1.5" (1.562 m)    Assessment & Plan:

## 2021-08-03 NOTE — Patient Instructions (Signed)
We will get you in for the hearing test and do the labs today.

## 2021-08-06 ENCOUNTER — Other Ambulatory Visit: Payer: Self-pay | Admitting: Internal Medicine

## 2021-08-06 DIAGNOSIS — H9193 Unspecified hearing loss, bilateral: Secondary | ICD-10-CM | POA: Insufficient documentation

## 2021-08-06 MED ORDER — SIMVASTATIN 20 MG PO TABS: 20.0000 mg | ORAL_TABLET | Freq: Every day | ORAL | 3 refills | Status: AC

## 2021-08-06 NOTE — Assessment & Plan Note (Signed)
New Rx for oxybutynin 10 mg xl as this is disrupting her sleep.

## 2021-08-06 NOTE — Assessment & Plan Note (Signed)
Referral to audiology for hearing assessment.

## 2021-08-06 NOTE — Assessment & Plan Note (Signed)
Checking B12. Recent CBC and CMP okay. Adjust as needed.

## 2021-08-06 NOTE — Assessment & Plan Note (Signed)
Previously B12 low and not taking supplement currently. Checking B12 and adjust as needed.

## 2021-08-06 NOTE — Assessment & Plan Note (Signed)
Flu shot yearly. covid-19 counseled. Pneumonia complete. Shingrix counseled to get at pharmacy. Tetanus due 2025. Colonoscopy due referral to GI. Mammogram aged out, pap smear aged out and dexa due deferred today. Counseled about sun safety and mole surveillance. Counseled about the dangers of distracted driving. Given 10 year screening recommendations.

## 2021-08-06 NOTE — Assessment & Plan Note (Signed)
Not seen since 2021 and went off her statin. Previously on simvastatin. Checking lipid panel and adjust as needed.

## 2021-08-14 DIAGNOSIS — H903 Sensorineural hearing loss, bilateral: Secondary | ICD-10-CM | POA: Diagnosis not present

## 2021-10-27 DIAGNOSIS — Z23 Encounter for immunization: Secondary | ICD-10-CM | POA: Diagnosis not present

## 2021-10-29 DIAGNOSIS — H04123 Dry eye syndrome of bilateral lacrimal glands: Secondary | ICD-10-CM | POA: Diagnosis not present

## 2021-10-29 DIAGNOSIS — H26493 Other secondary cataract, bilateral: Secondary | ICD-10-CM | POA: Diagnosis not present

## 2021-10-29 DIAGNOSIS — H43813 Vitreous degeneration, bilateral: Secondary | ICD-10-CM | POA: Diagnosis not present

## 2021-10-29 DIAGNOSIS — H43393 Other vitreous opacities, bilateral: Secondary | ICD-10-CM | POA: Diagnosis not present

## 2021-11-02 ENCOUNTER — Encounter: Payer: Self-pay | Admitting: *Deleted

## 2022-02-24 ENCOUNTER — Other Ambulatory Visit (HOSPITAL_BASED_OUTPATIENT_CLINIC_OR_DEPARTMENT_OTHER): Payer: Self-pay

## 2022-05-18 DIAGNOSIS — R030 Elevated blood-pressure reading, without diagnosis of hypertension: Secondary | ICD-10-CM | POA: Diagnosis not present

## 2022-05-18 DIAGNOSIS — Z6824 Body mass index (BMI) 24.0-24.9, adult: Secondary | ICD-10-CM | POA: Diagnosis not present

## 2022-05-18 DIAGNOSIS — J0100 Acute maxillary sinusitis, unspecified: Secondary | ICD-10-CM | POA: Diagnosis not present

## 2022-05-20 ENCOUNTER — Other Ambulatory Visit: Payer: Self-pay | Admitting: Gynecology

## 2022-05-20 DIAGNOSIS — Z1231 Encounter for screening mammogram for malignant neoplasm of breast: Secondary | ICD-10-CM

## 2022-06-24 ENCOUNTER — Ambulatory Visit
Admission: RE | Admit: 2022-06-24 | Discharge: 2022-06-24 | Disposition: A | Discharge status: Home / Self Care | Payer: Medicare Other | Source: Ambulatory Visit | Attending: Gynecology | Admitting: Gynecology

## 2022-06-24 DIAGNOSIS — Z1231 Encounter for screening mammogram for malignant neoplasm of breast: Secondary | ICD-10-CM

## 2022-11-01 DIAGNOSIS — H04123 Dry eye syndrome of bilateral lacrimal glands: Secondary | ICD-10-CM | POA: Diagnosis not present

## 2022-11-01 DIAGNOSIS — H26493 Other secondary cataract, bilateral: Secondary | ICD-10-CM | POA: Diagnosis not present

## 2022-11-01 DIAGNOSIS — H16223 Keratoconjunctivitis sicca, not specified as Sjogren's, bilateral: Secondary | ICD-10-CM | POA: Diagnosis not present

## 2022-11-01 DIAGNOSIS — H43813 Vitreous degeneration, bilateral: Secondary | ICD-10-CM | POA: Diagnosis not present

## 2023-01-06 DIAGNOSIS — Z01419 Encounter for gynecological examination (general) (routine) without abnormal findings: Secondary | ICD-10-CM | POA: Diagnosis not present

## 2023-01-06 DIAGNOSIS — N952 Postmenopausal atrophic vaginitis: Secondary | ICD-10-CM | POA: Diagnosis not present

## 2023-01-06 DIAGNOSIS — Z780 Asymptomatic menopausal state: Secondary | ICD-10-CM | POA: Diagnosis not present

## 2023-01-06 DIAGNOSIS — Z01411 Encounter for gynecological examination (general) (routine) with abnormal findings: Secondary | ICD-10-CM | POA: Diagnosis not present

## 2023-01-31 DIAGNOSIS — D1801 Hemangioma of skin and subcutaneous tissue: Secondary | ICD-10-CM | POA: Diagnosis not present

## 2023-01-31 DIAGNOSIS — D225 Melanocytic nevi of trunk: Secondary | ICD-10-CM | POA: Diagnosis not present

## 2023-01-31 DIAGNOSIS — L821 Other seborrheic keratosis: Secondary | ICD-10-CM | POA: Diagnosis not present

## 2023-05-18 ENCOUNTER — Other Ambulatory Visit: Payer: Self-pay | Admitting: Gynecology

## 2023-05-18 DIAGNOSIS — Z1231 Encounter for screening mammogram for malignant neoplasm of breast: Secondary | ICD-10-CM

## 2023-06-30 ENCOUNTER — Ambulatory Visit
Admission: RE | Admit: 2023-06-30 | Discharge: 2023-06-30 | Disposition: A | Discharge status: Home / Self Care | Source: Ambulatory Visit | Attending: Gynecology | Admitting: Gynecology

## 2023-06-30 DIAGNOSIS — Z1231 Encounter for screening mammogram for malignant neoplasm of breast: Secondary | ICD-10-CM

## 2023-07-05 ENCOUNTER — Other Ambulatory Visit: Payer: Self-pay | Admitting: Gynecology

## 2023-07-05 DIAGNOSIS — R928 Other abnormal and inconclusive findings on diagnostic imaging of breast: Secondary | ICD-10-CM

## 2023-07-15 ENCOUNTER — Ambulatory Visit
Admission: RE | Admit: 2023-07-15 | Discharge: 2023-07-15 | Disposition: A | Discharge status: Home / Self Care | Source: Ambulatory Visit | Attending: Gynecology | Admitting: Gynecology

## 2023-07-15 DIAGNOSIS — R928 Other abnormal and inconclusive findings on diagnostic imaging of breast: Secondary | ICD-10-CM

## 2023-11-10 DIAGNOSIS — H43813 Vitreous degeneration, bilateral: Secondary | ICD-10-CM | POA: Diagnosis not present

## 2023-11-10 DIAGNOSIS — H26493 Other secondary cataract, bilateral: Secondary | ICD-10-CM | POA: Diagnosis not present

## 2023-11-10 DIAGNOSIS — H04123 Dry eye syndrome of bilateral lacrimal glands: Secondary | ICD-10-CM | POA: Diagnosis not present

## 2023-11-10 DIAGNOSIS — H16223 Keratoconjunctivitis sicca, not specified as Sjogren's, bilateral: Secondary | ICD-10-CM | POA: Diagnosis not present

## 2023-11-10 LAB — OPHTHALMOLOGY REPORT-SCANNED
